# Patient Record
Sex: Female | Born: 1980
Health system: Southern US, Community
[De-identification: ages and names within clinical notes are randomized; demographics above are authoritative.]

## PROBLEM LIST (undated history)

## (undated) DIAGNOSIS — J45909 Unspecified asthma, uncomplicated: Secondary | ICD-10-CM

## (undated) HISTORY — PX: NO PAST SURGERIES: SHX2092

---

## 2013-03-03 NOTE — L&D Delivery Note (Signed)
Delivery Note Progressed to complete dilation and pushed well.  At 4:05 PM a viable and healthy female was delivered via Vaginal, Spontaneous Delivery (Presentation: Right Occiput Anterior).  APGAR: 9, 9; weight .   No difficulty with shoulders. Placenta status: Intact, Spontaneous.  Cord: 3 vessels with the following complications: Nuchal cord, x 1, loose, and foot cord, tight    Anesthesia: Epidural  Episiotomy: None Lacerations:  Suture Repair: 3.0 monocryl Est. Blood Loss (mL): 200  Mom to postpartum.  Baby to Couplet care / Skin to Skin.  Wynelle Bourgeois 11/27/2013, 4:28 PM

## 2013-03-03 NOTE — L&D Delivery Note (Signed)
`````  Attestation of Attending Supervision of Advanced Practitioner: Evaluation and management procedures were performed by the PA/NP/CNM/OB Fellow under my supervision/collaboration. Chart reviewed and agree with management and plan. I was involved in monitoring of labor process.and second stage evals.  Finas Delone V 11/27/2013 5:44 PM

## 2013-04-06 LAB — OB RESULTS CONSOLE GC/CHLAMYDIA
Chlamydia: NEGATIVE
Gonorrhea: NEGATIVE

## 2013-10-20 ENCOUNTER — Other Ambulatory Visit (HOSPITAL_COMMUNITY): Payer: Self-pay | Admitting: Nurse Practitioner

## 2013-10-20 DIAGNOSIS — Z3689 Encounter for other specified antenatal screening: Secondary | ICD-10-CM

## 2013-10-20 LAB — OB RESULTS CONSOLE ANTIBODY SCREEN: Antibody Screen: NEGATIVE

## 2013-10-20 LAB — OB RESULTS CONSOLE RUBELLA ANTIBODY, IGM: Rubella: IMMUNE

## 2013-10-20 LAB — OB RESULTS CONSOLE ABO/RH: RH TYPE: POSITIVE

## 2013-10-20 LAB — OB RESULTS CONSOLE HEPATITIS B SURFACE ANTIGEN: HEP B S AG: NEGATIVE

## 2013-10-20 LAB — OB RESULTS CONSOLE RPR: RPR: NONREACTIVE

## 2013-10-20 LAB — OB RESULTS CONSOLE HIV ANTIBODY (ROUTINE TESTING): HIV: NONREACTIVE

## 2013-10-24 ENCOUNTER — Ambulatory Visit (HOSPITAL_COMMUNITY)
Admission: RE | Admit: 2013-10-24 | Discharge: 2013-10-24 | Disposition: A | Discharge status: Home / Self Care | Payer: Medicaid Other | Source: Ambulatory Visit | Attending: Nurse Practitioner | Admitting: Nurse Practitioner

## 2013-10-24 ENCOUNTER — Other Ambulatory Visit (HOSPITAL_COMMUNITY): Payer: Self-pay | Admitting: Nurse Practitioner

## 2013-10-24 DIAGNOSIS — Z1389 Encounter for screening for other disorder: Secondary | ICD-10-CM | POA: Diagnosis not present

## 2013-10-24 DIAGNOSIS — Z363 Encounter for antenatal screening for malformations: Secondary | ICD-10-CM | POA: Insufficient documentation

## 2013-10-24 DIAGNOSIS — Z3689 Encounter for other specified antenatal screening: Secondary | ICD-10-CM

## 2013-11-11 LAB — OB RESULTS CONSOLE GBS: STREP GROUP B AG: NEGATIVE

## 2013-11-27 ENCOUNTER — Inpatient Hospital Stay (HOSPITAL_COMMUNITY)
Admission: AD | Admit: 2013-11-27 | Discharge: 2013-11-29 | DRG: 775 | Disposition: A | Discharge status: Home / Self Care | Payer: Medicaid Other | Source: Ambulatory Visit | Attending: Obstetrics and Gynecology | Admitting: Obstetrics and Gynecology

## 2013-11-27 ENCOUNTER — Inpatient Hospital Stay (HOSPITAL_COMMUNITY): Payer: Medicaid Other

## 2013-11-27 ENCOUNTER — Encounter (HOSPITAL_COMMUNITY): Payer: Medicaid Other | Admitting: Anesthesiology

## 2013-11-27 ENCOUNTER — Encounter (HOSPITAL_COMMUNITY): Payer: Self-pay

## 2013-11-27 ENCOUNTER — Inpatient Hospital Stay (HOSPITAL_COMMUNITY): Payer: Medicaid Other | Admitting: Anesthesiology

## 2013-11-27 DIAGNOSIS — J45909 Unspecified asthma, uncomplicated: Secondary | ICD-10-CM

## 2013-11-27 DIAGNOSIS — Z8249 Family history of ischemic heart disease and other diseases of the circulatory system: Secondary | ICD-10-CM

## 2013-11-27 DIAGNOSIS — O479 False labor, unspecified: Secondary | ICD-10-CM | POA: Diagnosis present

## 2013-11-27 DIAGNOSIS — O368390 Maternal care for abnormalities of the fetal heart rate or rhythm, unspecified trimester, not applicable or unspecified: Secondary | ICD-10-CM | POA: Diagnosis present

## 2013-11-27 HISTORY — DX: Unspecified asthma, uncomplicated: J45.909

## 2013-11-27 LAB — CBC
HCT: 36.4 % (ref 36.0–46.0)
Hemoglobin: 12.6 g/dL (ref 12.0–15.0)
MCH: 29.1 pg (ref 26.0–34.0)
MCHC: 34.6 g/dL (ref 30.0–36.0)
MCV: 84.1 fL (ref 78.0–100.0)
PLATELETS: 172 10*3/uL (ref 150–400)
RBC: 4.33 MIL/uL (ref 3.87–5.11)
RDW: 14.1 % (ref 11.5–15.5)
WBC: 8.1 10*3/uL (ref 4.0–10.5)

## 2013-11-27 LAB — RPR

## 2013-11-27 MED ORDER — TERBUTALINE SULFATE 1 MG/ML IJ SOLN: 0.2500 mg | Freq: Once | INTRAMUSCULAR | Status: DC | PRN

## 2013-11-27 MED ORDER — LIDOCAINE HCL (PF) 1 % IJ SOLN
30.0000 mL | INTRAMUSCULAR | Status: AC | PRN
  Administered 2013-11-27 (×2): 5 mL via SUBCUTANEOUS

## 2013-11-27 MED ORDER — SENNOSIDES-DOCUSATE SODIUM 8.6-50 MG PO TABS
2.0000 | ORAL_TABLET | ORAL | Status: DC
  Administered 2013-11-27 – 2013-11-28 (×2): 2 via ORAL
  Filled 2013-11-27 (×2): qty 2

## 2013-11-27 MED ORDER — ONDANSETRON HCL 4 MG/2ML IJ SOLN: 4.0000 mg | INTRAMUSCULAR | Status: DC | PRN

## 2013-11-27 MED ORDER — EPHEDRINE 5 MG/ML INJ
10.0000 mg | INTRAVENOUS | Status: DC | PRN
  Filled 2013-11-27: qty 2

## 2013-11-27 MED ORDER — DIBUCAINE 1 % RE OINT: 1.0000 "application " | TOPICAL_OINTMENT | RECTAL | Status: DC | PRN

## 2013-11-27 MED ORDER — CITRIC ACID-SODIUM CITRATE 334-500 MG/5ML PO SOLN
30.0000 mL | ORAL | Status: DC | PRN
  Filled 2013-11-27: qty 15

## 2013-11-27 MED ORDER — OXYTOCIN 40 UNITS IN LACTATED RINGERS INFUSION - SIMPLE MED
62.5000 mL/h | INTRAVENOUS | Status: DC
  Administered 2013-11-27: 62.5 mL/h via INTRAVENOUS
  Filled 2013-11-27: qty 1000

## 2013-11-27 MED ORDER — OXYCODONE-ACETAMINOPHEN 5-325 MG PO TABS: 1.0000 | ORAL_TABLET | ORAL | Status: DC | PRN

## 2013-11-27 MED ORDER — ZOLPIDEM TARTRATE 5 MG PO TABS: 5.0000 mg | ORAL_TABLET | Freq: Every evening | ORAL | Status: DC | PRN

## 2013-11-27 MED ORDER — LACTATED RINGERS IV SOLN
INTRAVENOUS | Status: DC
  Administered 2013-11-27 (×2): via INTRAVENOUS

## 2013-11-27 MED ORDER — PHENYLEPHRINE 40 MCG/ML (10ML) SYRINGE FOR IV PUSH (FOR BLOOD PRESSURE SUPPORT)
80.0000 ug | PREFILLED_SYRINGE | INTRAVENOUS | Status: DC | PRN
  Filled 2013-11-27: qty 2
  Filled 2013-11-27: qty 10

## 2013-11-27 MED ORDER — PRENATAL MULTIVITAMIN CH
1.0000 | ORAL_TABLET | Freq: Every day | ORAL | Status: DC
  Administered 2013-11-28: 1 via ORAL
  Filled 2013-11-27: qty 1

## 2013-11-27 MED ORDER — PHENYLEPHRINE 40 MCG/ML (10ML) SYRINGE FOR IV PUSH (FOR BLOOD PRESSURE SUPPORT)
80.0000 ug | PREFILLED_SYRINGE | INTRAVENOUS | Status: DC | PRN
  Filled 2013-11-27: qty 2

## 2013-11-27 MED ORDER — OXYCODONE-ACETAMINOPHEN 5-325 MG PO TABS
2.0000 | ORAL_TABLET | Freq: Once | ORAL | Status: AC
  Administered 2013-11-27: 2 via ORAL
  Filled 2013-11-27: qty 2

## 2013-11-27 MED ORDER — ACETAMINOPHEN 325 MG PO TABS: 650.0000 mg | ORAL_TABLET | ORAL | Status: DC | PRN

## 2013-11-27 MED ORDER — ONDANSETRON HCL 4 MG PO TABS: 4.0000 mg | ORAL_TABLET | ORAL | Status: DC | PRN

## 2013-11-27 MED ORDER — TETANUS-DIPHTH-ACELL PERTUSSIS 5-2.5-18.5 LF-MCG/0.5 IM SUSP: 0.5000 mL | Freq: Once | INTRAMUSCULAR | Status: DC

## 2013-11-27 MED ORDER — WITCH HAZEL-GLYCERIN EX PADS: 1.0000 "application " | MEDICATED_PAD | CUTANEOUS | Status: DC | PRN

## 2013-11-27 MED ORDER — BENZOCAINE-MENTHOL 20-0.5 % EX AERO
1.0000 "application " | INHALATION_SPRAY | CUTANEOUS | Status: DC | PRN
  Administered 2013-11-27: 1 via TOPICAL
  Filled 2013-11-27: qty 56

## 2013-11-27 MED ORDER — LACTATED RINGERS IV SOLN
INTRAVENOUS | Status: DC
  Administered 2013-11-27: 13:00:00 via INTRAUTERINE

## 2013-11-27 MED ORDER — LANOLIN HYDROUS EX OINT: TOPICAL_OINTMENT | CUTANEOUS | Status: DC | PRN

## 2013-11-27 MED ORDER — LACTATED RINGERS IV SOLN
500.0000 mL | Freq: Once | INTRAVENOUS | Status: AC
  Administered 2013-11-27: 500 mL via INTRAVENOUS

## 2013-11-27 MED ORDER — DIPHENHYDRAMINE HCL 25 MG PO CAPS
25.0000 mg | ORAL_CAPSULE | Freq: Four times a day (QID) | ORAL | Status: DC | PRN
Start: 2013-11-27 — End: 2013-11-29

## 2013-11-27 MED ORDER — FENTANYL 2.5 MCG/ML BUPIVACAINE 1/10 % EPIDURAL INFUSION (WH - ANES)
14.0000 mL/h | INTRAMUSCULAR | Status: DC | PRN
  Administered 2013-11-27 (×2): 14 mL/h via EPIDURAL
  Filled 2013-11-27 (×2): qty 125

## 2013-11-27 MED ORDER — LACTATED RINGERS IV BOLUS (SEPSIS)
500.0000 mL | Freq: Once | INTRAVENOUS | Status: AC
  Administered 2013-11-27: 1000 mL via INTRAVENOUS

## 2013-11-27 MED ORDER — ONDANSETRON HCL 4 MG/2ML IJ SOLN: 4.0000 mg | Freq: Four times a day (QID) | INTRAMUSCULAR | Status: DC | PRN

## 2013-11-27 MED ORDER — LACTATED RINGERS IV SOLN
500.0000 mL | INTRAVENOUS | Status: DC | PRN
  Administered 2013-11-27 (×2): 500 mL via INTRAVENOUS

## 2013-11-27 MED ORDER — OXYCODONE-ACETAMINOPHEN 5-325 MG PO TABS: 2.0000 | ORAL_TABLET | ORAL | Status: DC | PRN

## 2013-11-27 MED ORDER — SIMETHICONE 80 MG PO CHEW: 80.0000 mg | CHEWABLE_TABLET | ORAL | Status: DC | PRN

## 2013-11-27 MED ORDER — OXYTOCIN BOLUS FROM INFUSION
500.0000 mL | INTRAVENOUS | Status: DC
Start: 2013-11-27 — End: 2013-11-27

## 2013-11-27 MED ORDER — DIPHENHYDRAMINE HCL 50 MG/ML IJ SOLN: 12.5000 mg | INTRAMUSCULAR | Status: DC | PRN

## 2013-11-27 MED ORDER — OXYTOCIN 40 UNITS IN LACTATED RINGERS INFUSION - SIMPLE MED
1.0000 m[IU]/min | INTRAVENOUS | Status: DC
Start: 2013-11-27 — End: 2013-11-27
  Administered 2013-11-27: 2 m[IU]/min via INTRAVENOUS

## 2013-11-27 MED ORDER — IBUPROFEN 600 MG PO TABS
600.0000 mg | ORAL_TABLET | Freq: Four times a day (QID) | ORAL | Status: DC
  Administered 2013-11-27 – 2013-11-29 (×7): 600 mg via ORAL
  Filled 2013-11-27 (×6): qty 1

## 2013-11-27 NOTE — Progress Notes (Signed)
Patient ID: Marissa Butler, female   DOB: 1980-08-02, 33 y.o.   MRN: [id number] FHR stable with intermittent variable decels UCs every 7 minutes  Will start Pitocin augmentation

## 2013-11-27 NOTE — Progress Notes (Signed)
Patient ID: Marissa Butler, female   DOB: 1980-06-07, 33 y.o.   MRN: [id number] Doing well.  UCs irregular FHR stable with average variability Intermittent variable decels  Dilation: 1 Effacement (%): 80 Cervical Position: Posterior Station: -2 Presentation: Vertex Exam by:: Dr Loreta Ave (BBOW)  Will continue to observe Dr Emelda Fear aware of tracing

## 2013-11-27 NOTE — Progress Notes (Signed)
LABOR PROGRESS NOTE  Marissa Butler is a 33 y.o. G3P1011 at [redacted]w[redacted]d  admitted for induction of labor due to Non-reactive NST.  Subjective: Uncomfortable with contractions  Objective: BP 127/75  Pulse 88  Temp(Src) 97.9 F (36.6 C) (Oral)  Resp 18 or  Filed Vitals:   11/27/13 0148 11/27/13 0410 11/27/13 0426  BP: 121/71 122/69 127/75  Pulse: 90 82 88  Temp: 98.1 F (36.7 C) 98.1 F (36.7 C) 97.9 F (36.6 C)  TempSrc: Axillary Oral Oral  Resp: FHT:  FHR: 150 bpm, variability: moderate,  accelerations:  Abscent,  decelerations:  Absent UC:   q75min SVE:   Dilation: 1 Effacement (%): 80 Station: -2 Exam by:: Dr Loreta Ave (BBOW)  Dilation: 1 Effacement (%): 80 Cervical Position: Posterior Station: -2 Presentation: Vertex Exam by:: Dr Loreta Ave (BBOW)    Labs: Lab Results  Component Value Date   WBC 8.1 11/27/2013   HGB 12.6 11/27/2013   HCT 36.4 11/27/2013   MCV 84.1 11/27/2013   PLT 172 11/27/2013    Assessment / Plan: Induction of labor due to non-reassuring fetal testing,  progressing well on pitocin  Labor: FB placed without complication at 0523 Fetal Wellbeing:  Category I Pain Control:  Fentanyl AnticipAlia ParsleyVD  Reegan Bouffard ROCIO, MD 11/27/2013, 5:25 AM

## 2013-11-27 NOTE — Anesthesia Preprocedure Evaluation (Signed)
Anesthesia Evaluation  Patient identified by MRN, date of birth, ID band Patient awake    Reviewed: Allergy & Precautions, H&P , NPO status , Patient's Chart, lab work & pertinent test results  History of Anesthesia Complications Negative for: history of anesthetic complications  Airway Mallampati: II TM Distance: >3 FB Neck ROM: Full    Dental  (+) Teeth Intact   Pulmonary asthma ,          Cardiovascular negative cardio ROS  Rhythm:Regular     Neuro/Psych negative neurological ROS     GI/Hepatic negative GI ROS, Neg liver ROS,   Endo/Other  negative endocrine ROS  Renal/GU negative Renal ROS     Musculoskeletal   Abdominal   Peds  Hematology negative hematology ROS (+)   Anesthesia Other Findings   Reproductive/Obstetrics (+) Pregnancy                           Anesthesia Physical Anesthesia Plan  ASA: II  Anesthesia Plan: Epidural   Post-op Pain Management:    Induction:   Airway Management Planned:   Additional Equipment:   Intra-op Plan:   Post-operative Plan:   Informed Consent: I have reviewed the patients History and Physical, chart, labs and discussed the procedure including the risks, benefits and alternatives for the proposed anesthesia with the patient or authorized representative who has indicated his/her understanding and acceptance.     Plan Discussed with: Anesthesiologist  Anesthesia Plan Comments:         Anesthesia Quick Evaluation

## 2013-11-27 NOTE — H&P (Signed)
LABOR ADMISSION HISTORY AND PHYSICAL  Marissa Butler is a 33 y.o. female G3P1011 with IUP at [redacted]w[redacted]d by LMP presenting for contractions, cat II strip nonreactive with minimal variability. She reports +FMs, No LOF, no VB, no blurry vision, headaches or peripheral edema, and RUQ pain. She desires an epidural for labor pain control. She plans on breast and bottle feeding. She request OCPs for birth control.  Dating: By LMP cw [redacted]w[redacted]d sono --->  Estimated Date of Delivery: 12/07/13    Prenatal History/Complications:  Past Medical History: Past Medical History  Diagnosis Date  . Asthma     Past Surgical History: Past Surgical History  Procedure Laterality Date  . No past surgeries      Obstetrical History: OB History   Grav Para Term Preterm Abortions TAB SAB Ect Mult Living   Social History: History   Social History  . Marital Status: Single    Spouse Name: N/A    Number of Children: N/A  . Years of Education: N/A   Social History Main Topics  . Smoking status: Never Smoker   . Smokeless tobacco: Never Used  . Alcohol Use: No  . Drug Use: No  . Sexual Activity: None   Other Topics Concern  . None   Social History Narrative  . None    Family History: Family History  Problem Relation Age of Onset  . Hypertension Mother     Allergies: Allergies  Allergen Reactions  . Shellfish Allergy     No prescriptions prior to admission     Review of Systems   All systems reviewed and negative except as stated in HPI  Blood pressure 121/71, pulse 90, temperature 98.1 F (36.7 C), temperature source Axillary, resp. rate 20. General appearance: alert, cooperative and moderate distress Lungs: clear to auscultation bilaterally Heart: regular rate and rhythm Abdomen: soft, non-tender; bowel sounds normal Pelvic: adequate Extremities: Homans sign is negative, no sign of DVT DTR's not examined Presentation: cephalic Fetal monitoringBaseline: 140  bpm, Variability: Fair (1-6 bpm), Accelerations: not reactive and Decelerations: Absent Uterine activityq12min  Dilation: 1 Effacement (%): 80 Station: -2 Exam by:: Dr Loreta Ave (BBOW)   Prenatal labs: ABO, Rh: O/Positive/-- (08/20 0000) Antibody: Negative (08/20 0000) Rubella:   RPR: Nonreactive (08/20 0000)  HBsAg: Negative (08/20 0000)  HIV: Non-reactive (08/20 0000)  GBS: Negative (09/11 0000)  1 hr Glucola 103 Genetic screening  Not performed Anatomy US normal    No results found for this or any previous visit (from the past 24 hour(s)).  There are no active problems to display for this patient.   Assessment: Marissa Butler is a 33 y.o. G3P1011 at [redacted]w[redacted]d here for labor check with cat II strip, minimal variability and nonreactive, BPP 6/8 admit for induction of labor  #Labor:patient has made cervical change will in MAU (albeit minimal), will monitor and augment if needed, will place foley at L&D #Pain: Judicious with pain medicines given minimal variability #FWB: Cat II, BPP 6/8 (-2 breathing) #ID:  GBS neg #MOF: breast #MOC:OCPs #Circ:  n/a  Jimmie Rueter ROCIO 11/27/2013, 3:40 AM

## 2013-11-27 NOTE — H&P (Signed)
Attestation of Attending Supervision of Fellow: Evaluation and management procedures were performed by the Fellow under my supervision and collaboration.  I have reviewed the Fellow's note and chart, and I agree with the management and plan.    

## 2013-11-27 NOTE — Anesthesia Procedure Notes (Addendum)
Epidural Patient location during procedure: OB Start time: 11/27/2013 7:33 AM End time: 11/27/2013 7:46 AM  Staffing Anesthesiologist: Jassen Sarver, CHRIS Performed by: anesthesiologist   Preanesthetic Checklist Completed: patient identified, surgical consent, pre-op evaluation, timeout performed, IV checked, risks and benefits discussed and monitors and equipment checked  Epidural Patient position: sitting Prep: site prepped and draped and DuraPrep Patient monitoring: heart rate, cardiac monitor, continuous pulse ox and blood pressure Approach: midline Location: L4-L5 Injection technique: LOR saline  Needle:  Needle type: Tuohy  Needle gauge: 17 G Needle length: 9 cm Needle insertion depth: 4 cm Catheter type: closed end flexible Catheter size: 19 Gauge Catheter at skin depth: 12 cm Test dose: Other and negative  Assessment Events: blood not aspirated, injection not painful, no injection resistance, negative IV test and no paresthesia  Additional Notes H+P and labs checked, risks and benefits discussed with the patient, consent obtained, procedure tolerated well and without complications.  Reason for block:procedure for pain

## 2013-11-27 NOTE — Progress Notes (Signed)
Marissa Butler is a 33 y.o. G3P1011 at [redacted]w[redacted]d admitted for nonreactive nst, with cautious monitoring of labor, with intermittent variables with contractions. FHR alternates between Cat I and Cat II. She did not tolerate pitocin after AROM, and that was d/c'd , and has progressed with q 5 min contractions to current dilation of 9/100/0. FSE and IUPC in place. MVU aprox 140  Subjective:   Objective: BP 117/73  Pulse 81  Temp(Src) 98.7 F (37.1 C) (Oral)  Resp 18  Ht  (1.753 m)  Wt 81.194 kg (179 lb)  BMI 26.42 kg/m2  SpO2 100%   Total I/O In: -  Out: 350 [Urine:350]  FHT:  FHR: 150 bpm, variability: moderate,  accelerations:  Present,  decelerations:  Present variables with usual prompt recovery, occasional slow recovery. UC:   regular, every 5 minutes SVE:   Dilation: 9 Effacement (%): 100 Station: 0 Exam by:: dr Emelda Fear  Labs: Lab Results  Component Value Date   WBC 8.1 11/27/2013   HGB 12.6 11/27/2013   HCT 36.4 11/27/2013   MCV 84.1 11/27/2013   PLT 172 11/27/2013    Assessment / Plan: Induction of labor due to non-reassuring fetal testing,  progressing well on pitocin  Labor: slow progress due to suboptimal uterine contraction intensity, but baby recovering between  contractions with current contraction frequency Preeclampsia:   Fetal Wellbeing:  Category II Pain Control:  Epidural I/D:  n/a Anticipated MOD:  NSVD and OR has been kept aware of concern of Cat II hr and room is set up, pt has consented to section if fhr status concern worsens.  Naziah Weckerly V 11/27/2013, 3:16 PM

## 2013-11-27 NOTE — MAU Note (Signed)
Contractions every 3-4 min.  Stronger in last 2 hours. Was 1 cm at last visit.

## 2013-11-28 NOTE — Progress Notes (Signed)
`````  Attestation of Attending Supervision of Advanced Practitioner: Evaluation and management procedures were performed by the PA/NP/CNM/OB Fellow under my supervision/collaboration. Chart reviewed and agree with management and plan. I visited pt, confirmed exam and plan.  Marissa Butler V 11/28/2013 7:59 AM

## 2013-11-28 NOTE — Anesthesia Postprocedure Evaluation (Signed)
  Anesthesia Post-op Note  Patient: Conservator, museum/gallery  Procedure(s) Performed: * No procedures listed *  Patient Location: Mother/Baby  Anesthesia Type:Epidural  Level of Consciousness: awake, alert , oriented and patient cooperative  Airway and Oxygen Therapy: Patient Spontanous Breathing  Post-op Pain: mild  Post-op Assessment: Post-op Vital signs reviewed, Patient's Cardiovascular Status Stable, Respiratory Function Stable, Patent Airway, No signs of Nausea or vomiting, Adequate PO intake, Pain level controlled, No headache, No backache, No residual numbness and No residual motor weakness  Post-op Vital Signs: Reviewed and stable  Last Vitals:  Filed Vitals:   11/28/13 0755  BP: 110/70  Pulse: 84  Temp: 36.8 C  Resp: 20    Complications: No apparent anesthesia complications

## 2013-11-28 NOTE — Progress Notes (Signed)
Post Partum Day 1 Subjective: up ad lib, voiding, tolerating PO and + flatus (-) BM, Pain (0/10), Ambulating  Objective: Blood pressure 110/65, pulse 76, temperature 98.2 F (36.8 C), temperature source Oral, resp. rate 18, height  (1.753 m), weight 81.194 kg (179 lb), SpO2 100.00%, unknown if currently breastfeeding.  Physical Exam:  General: alert, appears stated age and no distress Lochia: appropriate Uterine Fundus: firm DVT Evaluation: No evidence of DVT seen on physical exam. Negative Homan's sign. No cords or calf tenderness.   Recent Labs  11/27/13 0145  HGB 12.6  HCT 36.4    Assessment/Plan: Plan for discharge tomorrow, Breastfeeding and Contraception pills   LOS: 1 day   Rosemary Holms 11/28/2013, 7:49 AM

## 2013-11-28 NOTE — Progress Notes (Signed)
Ur chart review completed.  

## 2013-11-28 NOTE — Lactation Note (Signed)
This note was copied from the chart of Girl Alexus Krisko. Lactation Consultation Note Breast fed her first child for 2 days until she left the hospital and went home. Found it challenging to latch the baby so she started bottle/formula feeding her baby. Mom states she wants to BF this baby and is really going to try harder to stick with it. Mom has good everted nipples, baby is latching well. Discussed importance of depth, obtaining a good latch to prevent nipple soreness, denies any at this time, or painful latches. Hand expression taught noted good colostrum flow. Breast massaging during feeding encouraged. Mom encouraged to feed baby 8-12 times/24 hours and with feeding cues. Mom encouraged to waken baby for feeds.  Educated about newborn behavior. Referred to Baby and Me Book in Breastfeeding section Pg. 22-23 for position options and Proper latch demonstration.Encouraged comfort during BF so colostrum flows better and mom will enjoy the feeding longer. Taking deep breaths and breast massage during BF. Mom encouraged to do skin-to-skin. Swallows heard. WH/LC brochure given w/resources, support groups and LC services. Mom reports + breast changes w/pregnancy.  Patient Name: Girl Kassidi Elza RUEAV'W Date: 11/28/2013 Reason for consult: Initial assessment   Maternal Data Has patient been taught Hand Expression?: Yes Does the patient have breastfeeding experience prior to this delivery?: Yes  Feeding Feeding Type: Breast Fed Length of feed: 20 min (still feeding)  LATCH Score/Interventions Latch: Repeated attempts needed to sustain latch, nipple held in mouth throughout feeding, stimulation needed to elicit sucking reflex.  Audible Swallowing: A few with stimulation Intervention(s): Hand expression  Type of Nipple: Everted at rest and after stimulation  Comfort (Breast/Nipple): Soft / non-tender     Hold (Positioning): Assistance needed to correctly position infant at breast and  maintain latch. Intervention(s): Breastfeeding basics reviewed;Support Pillows;Position options;Skin to skin  LATCH Score: 7  Lactation Tools Discussed/Used     Consult Status Consult Status: Follow-up Date: 11/29/13 Follow-up type: In-patient    Charyl Dancer 11/28/2013, 4:58 AM

## 2013-11-29 MED ORDER — IBUPROFEN 600 MG PO TABS: 600.0000 mg | ORAL_TABLET | Freq: Four times a day (QID) | ORAL | Status: DC | PRN

## 2013-11-29 MED ORDER — PRENATAL MULTIVITAMIN CH: 1.0000 | ORAL_TABLET | Freq: Every day | ORAL | Status: DC

## 2013-11-29 NOTE — Discharge Instructions (Signed)

## 2013-11-29 NOTE — Lactation Note (Signed)
This note was copied from the chart of Marissa Tashaya Bacorn. Lactation Consultation Note  Baby cueing upon entering the room.  Mother placed baby in cradle hold.  Discussed how football hold would help with depth and soreness but mother states she prefers cradle. Some sucks and swallows observed.  Mother states her nipples are tender.  Provided comfort gels and a hand pump. Reviewed cluster feeding, monitoring voids/stools, engorgement care and suggest mother call if she needs further assistance. Mom encouraged to feed baby 8-12 times/24 hours and with feeding cues.    Patient Name: Marissa Butler NWGNF'AToday's Date: 11/29/2013 Reason for consult: Follow-up assessment   Maternal Data    Feeding Feeding Type: Breast Fed Length of feed: 25 min  LATCH Score/Interventions Latch: Grasps breast easily, tongue down, lips flanged, rhythmical sucking.  Audible Swallowing: A few with stimulation  Type of Nipple: Everted at rest and after stimulation  Comfort (Breast/Nipple): Filling, red/small blisters or bruises, mild/mod discomfort  Problem noted: Mild/Moderate discomfort Interventions (Mild/moderate discomfort): Comfort gels;Hand expression  Hold (Positioning): No assistance needed to correctly position infant at breast.  LATCH Score: 8  Lactation Tools Discussed/Used     Consult Status Consult Status: Complete    Hardie PulleyBerkelhammer, Azaryah Oleksy Boschen 11/29/2013, 9:41 AM

## 2013-11-29 NOTE — Discharge Summary (Signed)
Obstetric Discharge Summary Reason for Admission: onset of labor Prenatal Procedures: none Intrapartum Procedures: spontaneous vaginal delivery Postpartum Procedures: none Complications-Operative and Postpartum: vaginal laceration  Delivery Note At 4:05 PM a viable female was delivered via Vaginal, Spontaneous Delivery (Presentation: Right Occiput Anterior).  APGAR: 9, 9; weight 7 lb 4.4 oz (3300 g).   Placenta status: Intact, Spontaneous.  Cord: 3 vessels with the following complications: None.  Cord pH: not obtained  Anesthesia: Epidural  Episiotomy: None Lacerations: 2nd degree;Periurethral;Labial Suture Repair: 3.0 Est. Blood Loss (mL): 200  Mom to postpartum.  Baby to Couplet care / Skin to Skin.  Marissa Butler 11/29/2013, 8:46 AM     Hospital Course:  Active Problems:   Non-reassuring fetal heart tones complicating pregnancy, antepartum   Today: No acute events overnight.  Pt denies problems with ambulating, voiding or po intake.  She denies nausea or vomiting.  Pain is well controlled.  She has had flatus. She has had bowel movement.  Lochia Minimal.  Plan for birth control is  oral contraceptives (estrogen/progesterone).  Method of Feeding: Breast  Marissa Butler is a 33 y.o. [id number] s/p NSVD.  Patient presented to OBT contractions w/ cat II strip nonreactive with minimal variability and was admitted to L&D.  She has postpartum course that was uncomplicated including no problems with ambulating, PO intake, urination, pain, or bleeding. The pt feels ready to go home and  will be discharged with outpatient follow-up.    H/H: Lab Results  Component Value Date/Time   HGB 12.6 11/27/2013  1:45 AM   HCT 36.4 11/27/2013  1:45 AM    Discharge Diagnoses: Term Pregnancy-delivered  Discharge Information: Date: 11/29/2013 Activity: pelvic rest Diet: routine  Medications: PNV and Ibuprofen Breast feeding:  Yes Condition: stable Instructions: refer to  handout Discharge to: home   Discharge Instructions   Activity as tolerated    Complete by:  As directed      Call MD for:  difficulty breathing, headache or visual disturbances    Complete by:  As directed      Call MD for:  hives    Complete by:  As directed      Call MD for:  persistant dizziness or light-headedness    Complete by:  As directed      Call MD for:  persistant nausea and vomiting    Complete by:  As directed      Call MD for:  redness, tenderness, or signs of infection (pain, swelling, redness, odor or green/yellow discharge around incision site)    Complete by:  As directed      Call MD for:  severe uncontrolled pain    Complete by:  As directed      Call MD for:  temperature >100.4    Complete by:  As directed      Diet - low sodium heart healthy    Complete by:  As directed      Discharge instructions    Complete by:  As directed   Taking care of yourself after Baby arrives. Vaginal Bleeding: Vaginal bleeding is common after delivery, with the amount decreasing gradually over 1-2 weeks. If the bleeding increases, is mixed with pus, or is foul-smelling, call your doctor, as this may be a sign of infection.  Abdominal Pain: Abdominal cramping after delivery is common, especially when you breastfeed. The same hormones responsible for letting milk down to your nipple also contract your uterus. If the pain worsens, or occurs more frequently  over 48 hrs after delivery, call your doctor.  Fevers: After delivery you are at increased risk of developing an infection. If you have a fever, increased vaginal bleeding, foul-smelling vaginal discharge, or increased abdominal pain, call your doctor.  Breast Feeding: Feeding every 1.5-3 hours keeps Baby satisfied and your milk in good supply. If 3 hours have gone by and Baby is sleeping, wake him/her up to feed. Nurse for 15-20 minutes on one breast before offering the other. Breast-fed babies often lose up to 7% of their birth  weight in the first few days of life, but should start gaining about an ounce per day after 4 days. For more information about breastfeeding, go to [email or web address]://[email or web address]/breastfeeding.  Mastitis (Breast infection): Breaks in the skin or bacteria passing into your breast ducts can cause an infection. If you notice a triangular shaped area on your breast that is red, warm to the touch and tender, call your doctor. It is safe for Baby and helps you to heal faster if you keep breast feeding through this infection.  Postpartum Depression: Postpartum depression is very common after a woman delivers because of all the hormonal changes happening in her body. If you notice that you start to feel more sad or anxious than usual or have any thoughts of hurting yourself or Baby, tell someone right away.  If you have any questions or concerns, please call your doctor.            Medication List         ibuprofen 600 MG tablet  Commonly known as:  ADVIL,MOTRIN  Take 1 tablet (600 mg total) by mouth every 6 (six) hours as needed for cramping.     prenatal multivitamin Tabs tablet  Take 1 tablet by mouth daily at 12 noon.           Follow-up Information   [facility] [place]. Call today. (to make a postpartum appointment in 4-6 weeks)    Contact information:   [address] [place] [address] [phone]      Marissa Booneoberts, Masaji Billups C ,MD OB Fellow 11/29/2013,8:46 AM

## 2013-11-30 NOTE — Discharge Summary (Signed)
Attestation of Attending Supervision of Obstetric Fellow: Evaluation and management procedures were performed by the Obstetric Fellow under my supervision and collaboration.  I have reviewed the Obstetric Fellow's note and chart, and I agree with the management and plan.  Jacob Stinson, DO Attending Physician Faculty Practice, [facility]  

## 2014-01-03 ENCOUNTER — Encounter (HOSPITAL_COMMUNITY): Payer: Self-pay

## 2014-03-03 LAB — HM PAP SMEAR: HM PAP: NORMAL

## 2015-07-03 IMAGING — US US OB COMP +14 WK
1 series · 12 of 28 positions shown · non-contrast
Comparison: none

[Series 1: us ob detail +14 wk · 12 of 89 slices shown]
[im 4/89]
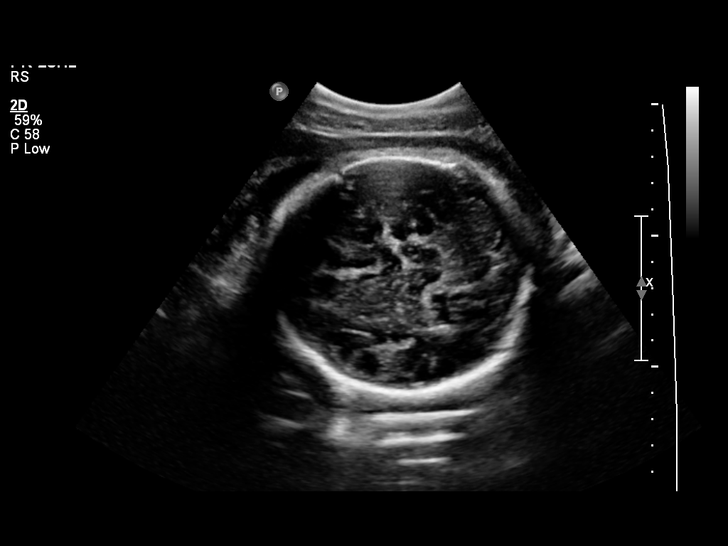
[im 10/89]
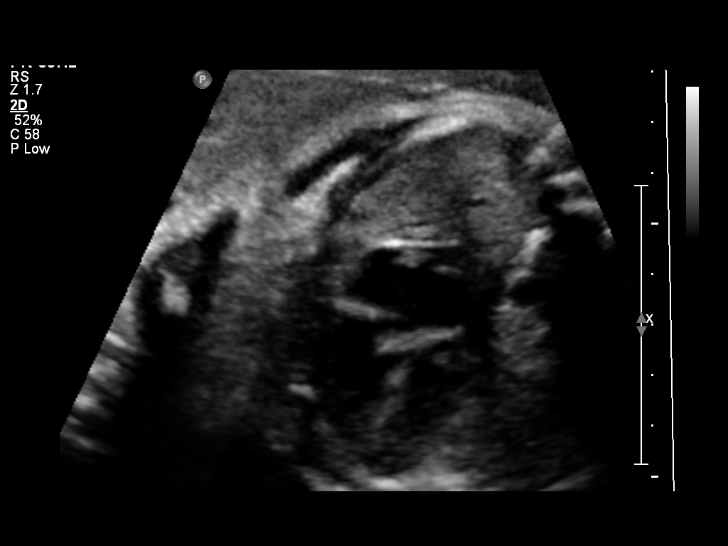
[im 17/89]
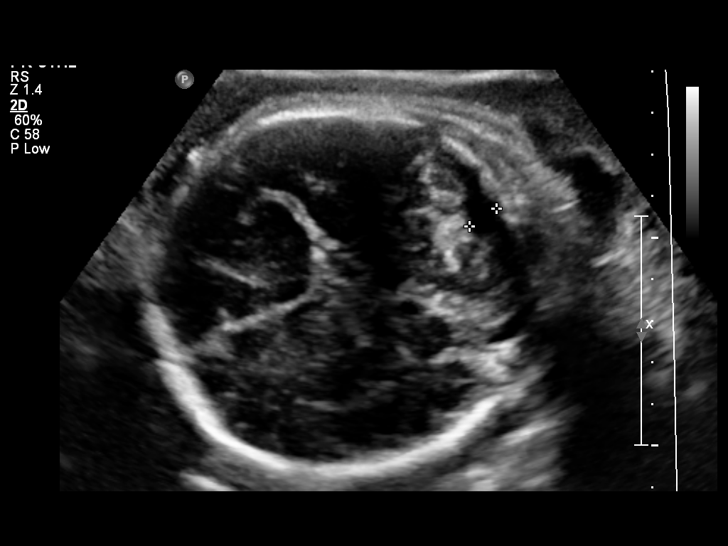
[im 27/89]
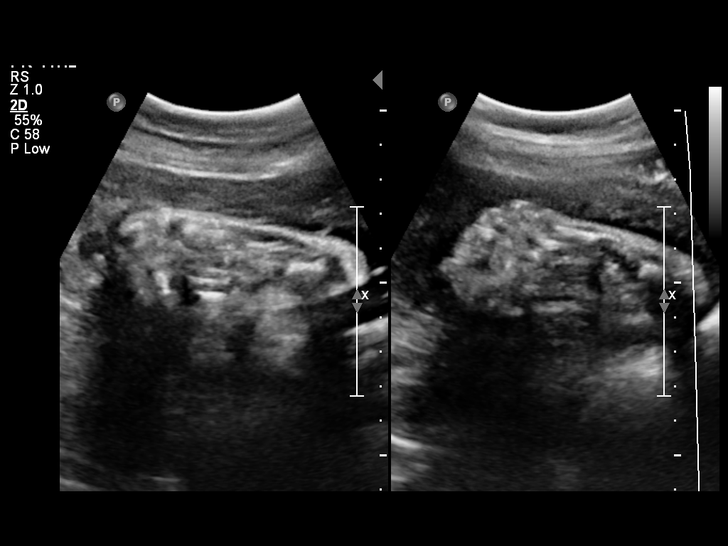
[im 33/89]
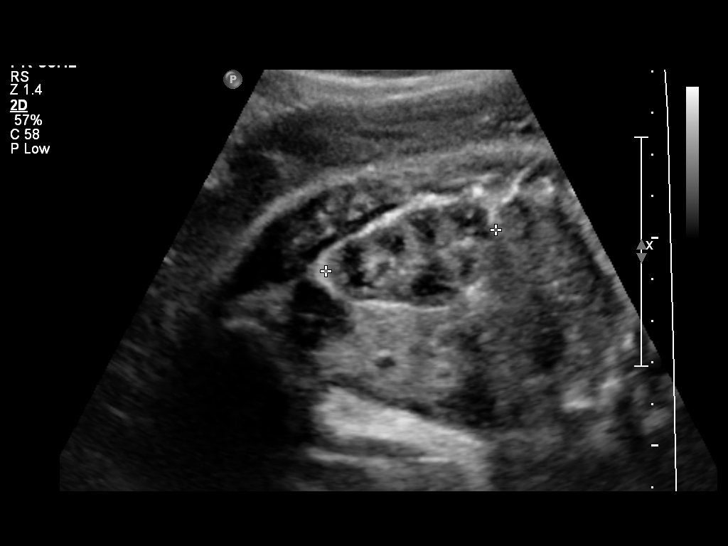
[im 40/89]
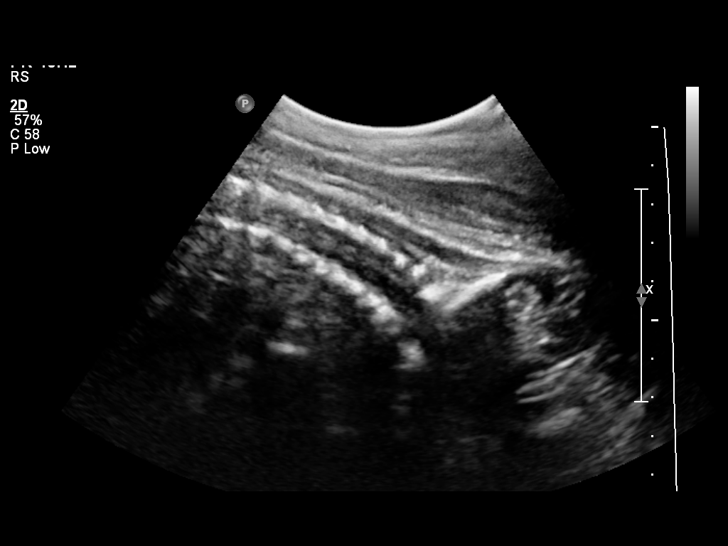
[im 49/89]
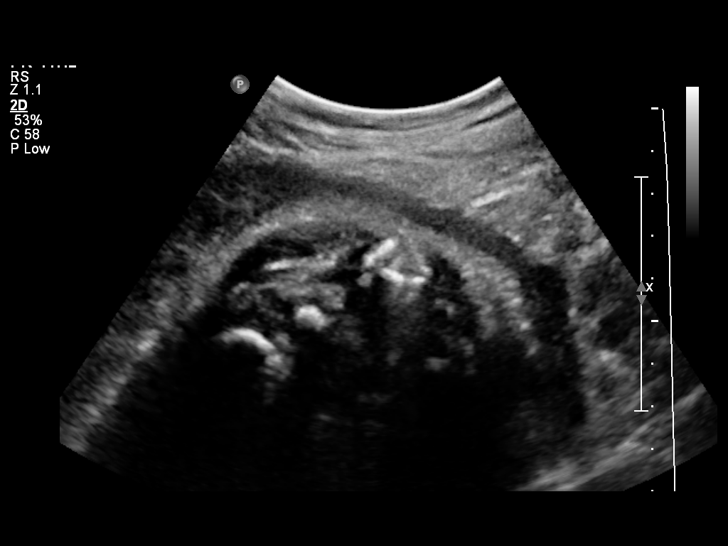
[im 56/89]
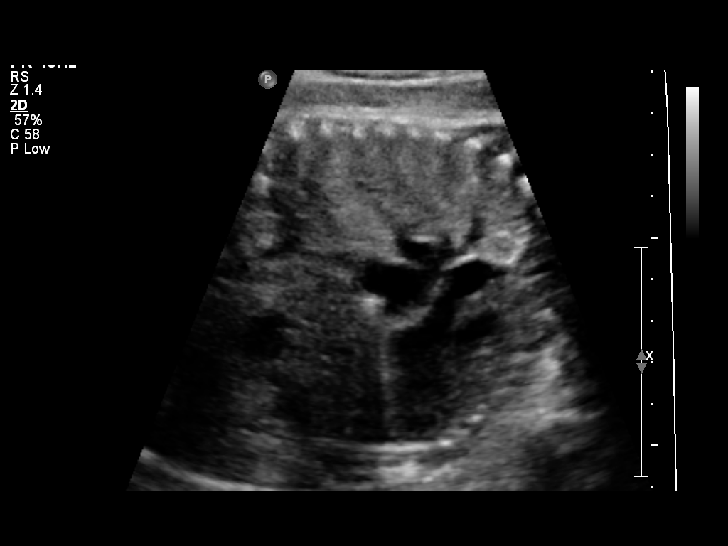
[im 62/89]
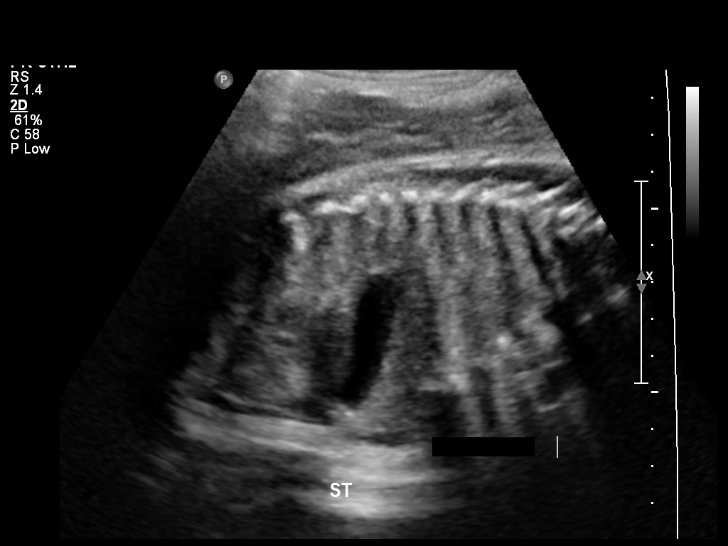
[im 72/89]
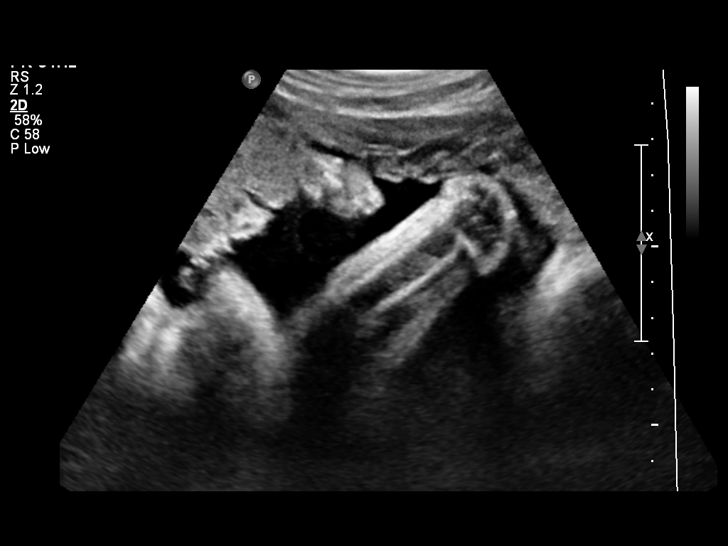
[im 79/89]
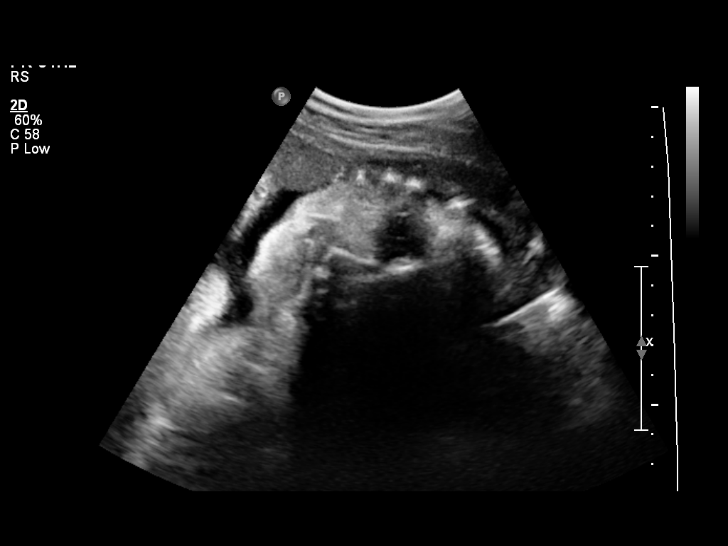
[im 85/89]
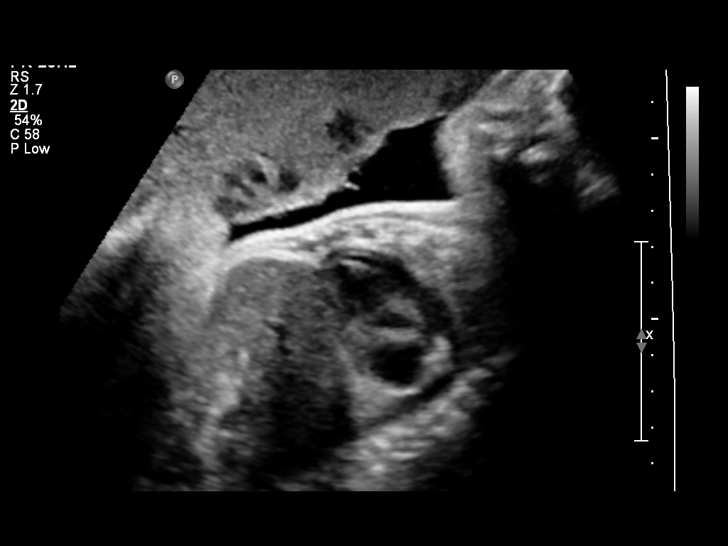

[12 of 28 positions shown; findings below may reference images not displayed]

OBSTETRICS REPORT
                      (Signed Final 10/24/2013 [DATE])

Service(s) Provided

 US OB COMP + 14 WK                                    76805.1
Indications

 Basic anatomic survey
Fetal Evaluation

 Num Of Fetuses:    1
 Fetal Heart Rate:  143                          bpm
 Cardiac Activity:  Observed
 Presentation:      Cephalic
 Placenta:          Anterior, above cervical os
 P. Cord            Visualized, central
 Insertion:

 Amniotic Fluid
 AFI FV:      Subjectively within normal limits
 AFI Sum:     10.06   cm       19  %Tile     Larg Pckt:    3.43  cm
 RUQ:   3.43    cm   RLQ:    3.37   cm    LUQ:   0.02    cm   LLQ:    3.24   cm
Biometry

 BPD:     88.7  mm     G. Age:  35w 6d                CI:        78.48   70 - 86
                                                      FL/HC:      21.1   19.4 -

 HC:     316.7  mm     G. Age:  35w 4d       62  %    HC/AC:      1.08   0.96 -

 AC:     294.6  mm     G. Age:  33w 3d       45  %    FL/BPD:     75.2   71 - 87
 FL:      66.7  mm     G. Age:  34w 2d       56  %    FL/AC:      22.6   20 - 24
 HUM:     57.3  mm     G. Age:  33w 2d       49  %

 Est. FW:    3552  gm      5 lb 3 oz     68  %
Gestational Age

 LMP:           33w 5d        Date:  03/02/13                 EDD:   12/07/13
 U/S Today:     34w 6d                                        EDD:   11/29/13
 Best:          33w 5d     Det. By:  LMP  (03/02/13)          EDD:   12/07/13
Anatomy

 Cranium:          Appears normal         Aortic Arch:      Not well visualized
 Fetal Cavum:      Appears normal         Ductal Arch:      Appears normal
 Ventricles:       Appears normal         Diaphragm:        Appears normal
 Choroid Plexus:   Appears normal         Stomach:          Appears normal, left
                                                            sided
 Cerebellum:       Appears normal         Abdomen:          Appears normal
 Posterior Fossa:  Appears normal         Abdominal Wall:   Not well visualized
 Nuchal Fold:      Appears normal         Cord Vessels:     Appears normal (3
                                                            vessel cord)
 Face:             Appears normal         Kidneys:          Appear normal
                   (orbits and profile)
 Lips:             Appears normal         Bladder:          Appears normal
 Heart:            Appears normal         Spine:            Appears normal
                   (4CH, axis, and
                   situs)
 RVOT:             Appears normal         Lower             Appears normal
                                          Extremities:
 LVOT:             Appears normal         Upper             Not well visualized
                                          Extremities:

 Other:  Female gender. Heels visualized. Nasal bone visualized. Technically
         difficult due to advanced GA and fetal position.
Targeted Anatomy

 Fetal Central Nervous System
 Lat. Ventricles:  4.3                    Cisterna Magna:
Cervix Uterus Adnexa

 Cervix:       Not visualized (advanced GA >64wks)
 Left Ovary:    Size(cm) L: 1.8 x W: 1.63 x H: 1.38  Volume(cc):
                Within normal limits.
 Right Ovary:   No adnexal mass visualized.
Impression

 SIUP at 33+5 weeks
 Normal detailed fetal anatomy; limited views of AA and CI
 Markers of aneuploidy: none
 Normal amniotic fluid volume
 Measurements consistent with LMP dating; EFW at the 68th
 %tile
Recommendations

 Follow-up as clinically indicated

## 2016-03-28 ENCOUNTER — Encounter: Payer: Self-pay | Admitting: Behavioral Health

## 2016-03-28 ENCOUNTER — Telehealth: Payer: Self-pay | Admitting: Behavioral Health

## 2016-03-28 NOTE — Telephone Encounter (Signed)
Pre-Visit Call completed with patient and chart updated.   Pre-Visit Info documented in Specialty Comments under Snap[employer]ot.    

## 2016-03-31 ENCOUNTER — Encounter: Payer: Self-pay | Admitting: Family Medicine

## 2016-03-31 ENCOUNTER — Ambulatory Visit (INDEPENDENT_AMBULATORY_CARE_PROVIDER_SITE_OTHER): Payer: 59 | Admitting: Family Medicine

## 2016-03-31 VITALS — BP 129/81 | HR 81 | Temp 97.1°F | Ht 69.0 in | Wt 166.0 lb

## 2016-03-31 DIAGNOSIS — Z130 Encounter for screening for diseases of the blood and blood-forming organs and certain disorders involving the immune mechanism: Secondary | ICD-10-CM | POA: Diagnosis not present

## 2016-03-31 DIAGNOSIS — Z131 Encounter for screening for diabetes mellitus: Secondary | ICD-10-CM

## 2016-03-31 DIAGNOSIS — Z3009 Encounter for other general counseling and advice on contraception: Secondary | ICD-10-CM

## 2016-03-31 DIAGNOSIS — Z13220 Encounter for screening for lipoid disorders: Secondary | ICD-10-CM | POA: Diagnosis not present

## 2016-03-31 DIAGNOSIS — Z1329 Encounter for screening for other suspected endocrine disorder: Secondary | ICD-10-CM

## 2016-03-31 DIAGNOSIS — J45990 Exercise induced bronchospasm: Secondary | ICD-10-CM

## 2016-03-31 LAB — COMPREHENSIVE METABOLIC PANEL
ALBUMIN: 4.4 g/dL (ref 3.5–5.2)
ALK PHOS: 70 U/L (ref 39–117)
ALT: 16 U/L (ref 0–35)
AST: 16 U/L (ref 0–37)
BUN: 10 mg/dL (ref 6–23)
CHLORIDE: 106 meq/L (ref 96–112)
CO2: 29 mEq/L (ref 19–32)
CREATININE: 1.02 mg/dL (ref 0.40–1.20)
Calcium: 9.7 mg/dL (ref 8.4–10.5)
GFR: 79.57 mL/min (ref >60.00)
Glucose, Bld: 82 mg/dL (ref 70–99)
Potassium: 4.5 mEq/L (ref 3.5–5.1)
SODIUM: 141 meq/L (ref 135–145)
Total Bilirubin: 0.4 mg/dL (ref 0.2–1.2)
Total Protein: 7.5 g/dL (ref 6.0–8.3)

## 2016-03-31 LAB — CBC
HCT: 41.4 % (ref 36.0–46.0)
Hemoglobin: 14.2 g/dL (ref 12.0–15.0)
MCHC: 34.4 g/dL (ref 30.0–36.0)
MCV: 85.9 fl (ref 78.0–100.0)
Platelets: 172 10*3/uL (ref 150.0–400.0)
RBC: 4.82 Mil/uL (ref 3.87–5.11)
RDW: 12.6 % (ref 11.5–15.5)
WBC: 4.3 10*3/uL (ref 4.0–10.5)

## 2016-03-31 LAB — LIPID PANEL
CHOLESTEROL: 161 mg/dL (ref 0–200)
HDL: 57.8 mg/dL (ref >39.00)
LDL CALC: 90 mg/dL (ref 0–99)
NonHDL: 102.84
Total CHOL/HDL Ratio: 3
Triglycerides: 66 mg/dL (ref 0.0–149.0)
VLDL: 13.2 mg/dL (ref 0.0–40.0)

## 2016-03-31 LAB — TSH: TSH: 1 u[IU]/mL (ref 0.35–4.50)

## 2016-03-31 LAB — HEMOGLOBIN A1C: Hgb A1c MFr Bld: 5 % (ref 4.6–6.5)

## 2016-03-31 MED ORDER — LEVONORGESTREL-ETHINYL ESTRAD 0.15-30 MG-MCG PO TABS: 1.0000 | ORAL_TABLET | Freq: Every day | ORAL | 4 refills | Status: DC

## 2016-03-31 MED ORDER — ALBUTEROL SULFATE HFA 108 (90 BASE) MCG/ACT IN AERS: 2.0000 | INHALATION_SPRAY | Freq: Four times a day (QID) | RESPIRATORY_TRACT | 0 refills | Status: DC | PRN

## 2016-03-31 NOTE — Progress Notes (Signed)
Pre visit review using our clinic review tool, if applicable. No additional management support is needed unless otherwise documented below in the visit note. 

## 2016-03-31 NOTE — Patient Instructions (Addendum)
It was very nice to see you today- we will get labs and be back in touch with your results asap Try the albuterol 1-2 puffs prior to exercise; let me know if this doe NOT seem to help you Take care and I am glad to see you as needed for physical exam or illness

## 2016-03-31 NOTE — Progress Notes (Signed)
[facility] [place] [address], [address] [place], [place] [address] [phone] [phone]  Date:  03/31/2016   Name:  Marissa CullensShavaughn Dicamillo   DOB:  02/19/1981   MRN:  [id number]  PCP:  Abbe AmsterdamOPLAND,JESSICA, MD    Chief Complaint: Establish Care (Pt here to est care. Had flu vaccine already. )   History of Present Illness:  Marissa Butler is a 36 y.o. very pleasant female patient who presents with the following:  Here today as a new patient to establish care- [place] moved [place] from [place] family members.  She has daughters ages 442 and [phone] She has generally been pretty healthy. She did have asthma in her younger years but does not have any wheezing.  However she does notice that she is sometimes SOB with exercise and wonders if this could be due to asthma Works in pt experience for [employer]  She had her flu shot for the year.    She has not had any recent labs but would like to have these today.  She is NOT fasting Her last pap was about 18 months ago.  Never had an abnl pap  She is on OCP- would like a Refill of same brand she is currently taking.  No history of DVT., PE, cancer, she does not smoke  LMP 1/19  Patient Active Problem List   Diagnosis Date Noted  . Non-reassuring fetal heart tones complicating pregnancy, antepartum 11/27/2013    Past Medical History:  Diagnosis Date  . Asthma     Past Surgical History:  Procedure Laterality Date  . NO PAST SURGERIES      Social History  Substance Use Topics  . Smoking status: Never Smoker  . Smokeless tobacco: Never Used  . Alcohol use No    Family History  Problem Relation Age of Onset  . Hypertension Mother     Allergies  Allergen Reactions  . Shellfish Allergy     Medication list has been reviewed and updated.  Current Outpatient Prescriptions on File Prior to Visit  Medication Sig Dispense Refill  . Naproxen Sodium (ALEVE PO) Take by mouth as needed.     No  current facility-administered medications on file prior to visit.     Review of Systems:  As per HPI- otherwise negative.   Physical Examination: Vitals:   03/31/16 1349  BP: 129/81  Pulse: 81  Temp: 97.1 F (36.2 C)   Vitals:   03/31/16 1349  Weight: 166 lb (75.3 kg)  Height: 5\' 9"  (1.753 m)   Body mass index is 24.51 kg/m. Ideal Body Weight: Weight in (lb) to have BMI = 25: 168.9  GEN: WDWN, NAD, Non-toxic, A & O x 3, normal weight, looks well and healthy HEENT: Atraumatic, Normocephalic. Neck supple. No masses, No LAD.  Bilateral TM wnl, oropharynx normal.  PEERL,EOMI.   Ears and Nose: No external deformity. CV: RRR, No M/G/R. No JVD. No thrill. No extra heart sounds. PULM: CTA B, no wheezes, crackles, rhonchi. No retractions. No resp. distress. No accessory muscle use. EXTR: No c/c/e NEURO Normal gait.  PSYCH: Normally interactive. Conversant. Not depressed or anxious appearing.  Calm demeanor.    Assessment and Plan: Screening for deficiency anemia - Plan: CBC  Screening for hyperlipidemia - Plan: Lipid panel  Screening for thyroid disorder - Plan: TSH  Screening for diabetes mellitus - Plan: Comprehensive metabolic panel, Hemoglobin A1c  Exercise-induced asthma - Plan: albuterol (PROVENTIL HFA;VENTOLIN HFA)  108 (90 Base) MCG/ACT inhaler  Encounter for other general counseling or advice on contraception - Plan: levonorgestrel-ethinyl estradiol (NORDETTE) 0.15-30 MG-MCG tablet  Here today to establish care Will obtain screening labs as above She will try albuterol as needed prior to exercise- will let me know if helpful Refilled her OCP for her today- she will continue to take this as usual   Signed Abbe Amsterdam, MD

## 2017-05-14 ENCOUNTER — Telehealth: Payer: Self-pay | Admitting: Family Medicine

## 2017-05-14 DIAGNOSIS — Z3009 Encounter for other general counseling and advice on contraception: Secondary | ICD-10-CM

## 2017-05-14 MED ORDER — LEVONORGESTREL-ETHINYL ESTRAD 0.15-30 MG-MCG PO TABS
1.0000 | ORAL_TABLET | Freq: Every day | ORAL | 0 refills | Status: DC
Start: 2017-05-14 — End: 2017-08-05

## 2017-05-14 NOTE — Telephone Encounter (Signed)
Copied from CRM [id number]. Topic: Quick Communication - Rx Refill/Question >> May 14, 2017 10:57 AM Maia Pettiesrtiz, Kristie S wrote: Medication: levonorgestrel-ethinyl estradiol (NORDETTE) 0.15-30 MG-MCG tablet - pt is scheduled for cpe 06/01/17 (first avail) Has the patient contacted their pharmacy? Yes.  No refills left Preferred Pharmacy (with phone number or street name): [id number] [place]- [place], [place] - [address] [phone] (Phone) [phone] (Fax)

## 2017-05-14 NOTE — Telephone Encounter (Signed)
Refill sent to pharmacy per pt request.

## 2017-05-31 NOTE — Progress Notes (Addendum)
[facility] at [facility] [address], [address] [place], [place] [address] [phone]  Date:  06/01/2017   Name:  Marissa Butler   DOB:  1980/08/10   MRN:  [id number]  PCP:  Pearline Cables, MD    Chief Complaint: Annual Exam (no pap)   History of Present Illness:  Marissa Butler is a 37 y.o. very pleasant female patient who presents with the following:  Here today for a CPE Last seen by myself in January of 2018:  Here today as a new patient to establish care- recently moved here from [place] to follow family members.  She has daughters ages 67 and 76 She has generally been pretty healthy. She did have asthma in her younger years but does not have any wheezing.  However she does notice that she is sometimes SOB with exercise and wonders if this could be due to asthma Works in pt experience for [employer] She had her flu shot for the year.   She has not had any recent labs but would like to have these today.  She is NOT fasting Her last pap was about 18 months ago.  Never had an abnl pap  Pap: last in 2015, she will get this done today. Never had an abnormal  Labs: 1/18- all ok Tdap: 2015 Flu: UTD  She is fasting today Her daughters are doing well She is using her inhaler for exercise but othewise her breathing has been ok  Not a smoker, not a drinker She does exercise daily- she generally lifts weight and does a bit of cardio as well Her eldest is graduating from HS this spring and is looking at colleges- hopefully [employer] or [employer] Her youngest stays home with her GM,will start pre-k next year  She is on OCP  She really has no concerns, she is generally in excellent health and feeling well    There are no active problems to display for this patient.   Past Medical History:  Diagnosis Date  . Asthma     Past Surgical History:  Procedure Laterality Date  . NO PAST SURGERIES      Social History   Tobacco Use  .  Smoking status: Never Smoker  . Smokeless tobacco: Never Used  Substance Use Topics  . Alcohol use: No  . Drug use: No    Family History  Problem Relation Age of Onset  . Hypertension Mother     Allergies  Allergen Reactions  . Shellfish Allergy     Medication list has been reviewed and updated.  Current Outpatient Medications on File Prior to Visit  Medication Sig Dispense Refill  . albuterol (PROVENTIL HFA;VENTOLIN HFA) 108 (90 Base) MCG/ACT inhaler Inhale 2 puffs into the lungs every 6 (six) hours as needed for wheezing or shortness of breath. 1 Inhaler 0  . levonorgestrel-ethinyl estradiol (NORDETTE) 0.15-30 MG-MCG tablet Take 1 tablet by mouth daily. 3 Package 0  . Naproxen Sodium (ALEVE PO) Take by mouth as needed.     No current facility-administered medications on file prior to visit.     Review of Systems:  As per HPI- otherwise negative.   Physical Examination: Vitals:   06/01/17 1409  BP: 120/72  Pulse: 77  Resp: 16  SpO2: 98%   Vitals:   06/01/17 1409  Weight: 161 lb 6.4 oz (73.2 kg)  Height: 5\' 9"  (1.753 m)   Body mass index is 23.83 kg/m. Ideal Body Weight:  Weight in (lb) to have BMI = 25: 168.9  GEN: WDWN, NAD, Non-toxic, A & O x 3, looks well, normal weight HEENT: Atraumatic, Normocephalic. Neck supple. No masses, No LAD.  Bilateral TM wnl, oropharynx normal.  PEERL,EOMI.   Ears and Nose: No external deformity. CV: RRR, No M/G/R. No JVD. No thrill. No extra heart sounds. PULM: CTA B, no wheezes, crackles, rhonchi. No retractions. No resp. distress. No accessory muscle use. ABD: S, NT, ND, +BS. No rebound. No HSM. EXTR: No c/c/e NEURO Normal gait.  PSYCH: Normally interactive. Conversant. Not depressed or anxious appearing.  Calm demeanor.  Breast: normal exam, no masses/ dimpling/ discharge Pelvic: normal, no vaginal lesions or discharge. Uterus normal, no CMT, no adnexal tendereness or masses    Assessment and Plan: Physical  exam  Screening for deficiency anemia - Plan: Lipid panel  Screening for hyperlipidemia - Plan: CBC  Screening for diabetes mellitus - Plan: Comprehensive metabolic panel, Hemoglobin A1c  Screening for cervical cancer - Plan: Cytology - PAP  Healthy woman here today for a CPE Await labs Encouraged her to continue her healthy habits  Signed Abbe AmsterdamJessica Moyses Pavey, MD  Received her labs 4/2-  Results for orders placed or performed in visit on 06/01/17  CBC  Result Value Ref Range   WBC 5.8 4.0 - 10.5 K/uL   RBC 5.09 3.87 - 5.11 Mil/uL   Platelets 217.0 150.0 - 400.0 K/uL   Hemoglobin 14.6 12.0 - 15.0 g/dL   HCT 78.243.8 95.636.0 - 21.346.0 %   MCV 85.9 78.0 - 100.0 fl   MCHC 33.4 30.0 - 36.0 g/dL   RDW 08.613.0 57.811.5 - 46.915.5 %  Comprehensive metabolic panel  Result Value Ref Range   Sodium 136 135 - 145 mEq/L   Potassium 3.6 3.5 - 5.1 mEq/L   Chloride 103 96 - 112 mEq/L   CO2 25 19 - 32 mEq/L   Glucose, Bld 66 (L) 70 - 99 mg/dL   BUN 12 6 - 23 mg/dL   Creatinine, Ser 6.291.10 0.40 - 1.20 mg/dL   Total Bilirubin 0.6 0.2 - 1.2 mg/dL   Alkaline Phosphatase 61 39 - 117 U/L   AST 16 0 - 37 U/L   ALT 13 0 - 35 U/L   Total Protein 7.5 6.0 - 8.3 g/dL   Albumin 4.0 3.5 - 5.2 g/dL   Calcium 8.8 8.4 - 52.810.5 mg/dL   GFR 41.3272.44 >44.01>60.00 mL/min  Hemoglobin A1c  Result Value Ref Range   Hgb A1c MFr Bld 5.2 4.6 - 6.5 %  Lipid panel  Result Value Ref Range   Cholesterol 157 0 - 200 mg/dL   Triglycerides 02.743.0 0.0 - 149.0 mg/dL   HDL 25.3656.10 >64.40>39.00 mg/dL   VLDL 8.6 0.0 - 34.740.0 mg/dL   LDL Cholesterol 92 0 - 99 mg/dL   Total CHOL/HDL Ratio 3    NonHDL 100.95    Message to pt, still await pap

## 2017-06-01 ENCOUNTER — Ambulatory Visit (INDEPENDENT_AMBULATORY_CARE_PROVIDER_SITE_OTHER): Payer: 59 | Admitting: Family Medicine

## 2017-06-01 ENCOUNTER — Other Ambulatory Visit (HOSPITAL_COMMUNITY)
Admission: RE | Admit: 2017-06-01 | Discharge: 2017-06-01 | Disposition: A | Discharge status: Home / Self Care | Payer: 59 | Source: Ambulatory Visit | Attending: Family Medicine | Admitting: Family Medicine

## 2017-06-01 ENCOUNTER — Encounter: Payer: Self-pay | Admitting: Family Medicine

## 2017-06-01 VITALS — BP 120/72 | HR 77 | Resp 16 | Ht 69.0 in | Wt 161.4 lb

## 2017-06-01 DIAGNOSIS — Z0000 Encounter for general adult medical examination without abnormal findings: Secondary | ICD-10-CM | POA: Diagnosis not present

## 2017-06-01 DIAGNOSIS — Z124 Encounter for screening for malignant neoplasm of cervix: Secondary | ICD-10-CM

## 2017-06-01 DIAGNOSIS — Z130 Encounter for screening for diseases of the blood and blood-forming organs and certain disorders involving the immune mechanism: Secondary | ICD-10-CM

## 2017-06-01 DIAGNOSIS — Z13220 Encounter for screening for lipoid disorders: Secondary | ICD-10-CM | POA: Diagnosis not present

## 2017-06-01 DIAGNOSIS — Z131 Encounter for screening for diabetes mellitus: Secondary | ICD-10-CM | POA: Diagnosis not present

## 2017-06-01 NOTE — Patient Instructions (Signed)
Great to see you again today-  I will be in touch with your labs and pap asap  Health Maintenance, Female Adopting a healthy lifestyle and getting preventive care can go a long way to promote health and wellness. Talk with your health care provider about what schedule of regular examinations is right for you. This is a good chance for you to check in with your provider about disease prevention and staying healthy. In between checkups, there are plenty of things you can do on your own. Experts have done a lot of research about which lifestyle changes and preventive measures are most likely to keep you healthy. Ask your health care provider for more information. Weight and diet Eat a healthy diet  Be sure to include plenty of vegetables, fruits, low-fat dairy products, and lean protein.  Do not eat a lot of foods high in solid fats, added sugars, or salt.  Get regular exercise. This is one of the most important things you can do for your health. ? Most adults should exercise for at least 150 minutes each week. The exercise should increase your heart rate and make you sweat (moderate-intensity exercise). ? Most adults should also do strengthening exercises at least twice a week. This is in addition to the moderate-intensity exercise.  Maintain a healthy weight  Body mass index (BMI) is a measurement that can be used to identify possible weight problems. It estimates body fat based on height and weight. Your health care provider can help determine your BMI and help you achieve or maintain a healthy weight.  For females 47 years of age and older: ? A BMI below 18.5 is considered underweight. ? A BMI of 18.5 to 24.9 is normal. ? A BMI of 25 to 29.9 is considered overweight. ? A BMI of 30 and above is considered obese.  Watch levels of cholesterol and blood lipids  You should start having your blood tested for lipids and cholesterol at 36 years of age, then have this test every 5 years.  You  may need to have your cholesterol levels checked more often if: ? Your lipid or cholesterol levels are high. ? You are older than 36 years of age. ? You are at high risk for heart disease.  Cancer screening Lung Cancer  Lung cancer screening is recommended for adults 51-78 years old who are at high risk for lung cancer because of a history of smoking.  A yearly low-dose CT scan of the lungs is recommended for people who: ? Currently smoke. ? Have quit within the past 15 years. ? Have at least a 30-pack-year history of smoking. A pack year is smoking an average of one pack of cigarettes a day for 1 year.  Yearly screening should continue until it has been 15 years since you quit.  Yearly screening should stop if you develop a health problem that would prevent you from having lung cancer treatment.  Breast Cancer  Practice breast self-awareness. This means understanding how your breasts normally appear and feel.  It also means doing regular breast self-exams. Let your health care provider know about any changes, no matter how small.  If you are in your 20s or 30s, you should have a clinical breast exam (CBE) by a health care provider every 1-3 years as part of a regular health exam.  If you are 53 or older, have a CBE every year. Also consider having a breast X-ray (mammogram) every year.  If you have a family history  of breast cancer, talk to your health care provider about genetic screening.  If you are at high risk for breast cancer, talk to your health care provider about having an MRI and a mammogram every year.  Breast cancer gene (BRCA) assessment is recommended for women who have family members with BRCA-related cancers. BRCA-related cancers include: ? Breast. ? Ovarian. ? Tubal. ? Peritoneal cancers.  Results of the assessment will determine the need for genetic counseling and BRCA1 and BRCA2 testing.  Cervical Cancer Your health care provider may recommend that you  be screened regularly for cancer of the pelvic organs (ovaries, uterus, and vagina). This screening involves a pelvic examination, including checking for microscopic changes to the surface of your cervix (Pap test). You may be encouraged to have this screening done every 3 years, beginning at age 25.  For women ages 44-65, health care providers may recommend pelvic exams and Pap testing every 3 years, or they may recommend the Pap and pelvic exam, combined with testing for human papilloma virus (HPV), every 5 years. Some types of HPV increase your risk of cervical cancer. Testing for HPV may also be done on women of any age with unclear Pap test results.  Other health care providers may not recommend any screening for nonpregnant women who are considered low risk for pelvic cancer and who do not have symptoms. Ask your health care provider if a screening pelvic exam is right for you.  If you have had past treatment for cervical cancer or a condition that could lead to cancer, you need Pap tests and screening for cancer for at least 20 years after your treatment. If Pap tests have been discontinued, your risk factors (such as having a new sexual partner) need to be reassessed to determine if screening should resume. Some women have medical problems that increase the chance of getting cervical cancer. In these cases, your health care provider may recommend more frequent screening and Pap tests.  Colorectal Cancer  This type of cancer can be detected and often prevented.  Routine colorectal cancer screening usually begins at 36 years of age and continues through 36 years of age.  Your health care provider may recommend screening at an earlier age if you have risk factors for colon cancer.  Your health care provider may also recommend using home test kits to check for hidden blood in the stool.  A small camera at the end of a tube can be used to examine your colon directly (sigmoidoscopy or  colonoscopy). This is done to check for the earliest forms of colorectal cancer.  Routine screening usually begins at age 52.  Direct examination of the colon should be repeated every 5-10 years through 36 years of age. However, you may need to be screened more often if early forms of precancerous polyps or small growths are found.  Skin Cancer  Check your skin from head to toe regularly.  Tell your health care provider about any new moles or changes in moles, especially if there is a change in a mole's shape or color.  Also tell your health care provider if you have a mole that is larger than the size of a pencil eraser.  Always use sunscreen. Apply sunscreen liberally and repeatedly throughout the day.  Protect yourself by wearing long sleeves, pants, a wide-brimmed hat, and sunglasses whenever you are outside.  Heart disease, diabetes, and high blood pressure  High blood pressure causes heart disease and increases the risk of stroke. High  blood pressure is more likely to develop in: ? People who have blood pressure in the high end of the normal range (130-139/85-89 mm Hg). ? People who are overweight or obese. ? People who are African American.  If you are 37-23 years of age, have your blood pressure checked every 3-5 years. If you are 10 years of age or older, have your blood pressure checked every year. You should have your blood pressure measured twice-once when you are at a hospital or clinic, and once when you are not at a hospital or clinic. Record the average of the two measurements. To check your blood pressure when you are not at a hospital or clinic, you can use: ? An automated blood pressure machine at a pharmacy. ? A home blood pressure monitor.  If you are between 87 years and 44 years old, ask your health care provider if you should take aspirin to prevent strokes.  Have regular diabetes screenings. This involves taking a blood sample to check your fasting blood sugar  level. ? If you are at a normal weight and have a low risk for diabetes, have this test once every three years after 36 years of age. ? If you are overweight and have a high risk for diabetes, consider being tested at a younger age or more often. Preventing infection Hepatitis B  If you have a higher risk for hepatitis B, you should be screened for this virus. You are considered at high risk for hepatitis B if: ? You were born in a country where hepatitis B is common. Ask your health care provider which countries are considered high risk. ? Your parents were born in a high-risk country, and you have not been immunized against hepatitis B (hepatitis B vaccine). ? You have HIV or AIDS. ? You use needles to inject street drugs. ? You live with someone who has hepatitis B. ? You have had sex with someone who has hepatitis B. ? You get hemodialysis treatment. ? You take certain medicines for conditions, including cancer, organ transplantation, and autoimmune conditions.  Hepatitis C  Blood testing is recommended for: ? Everyone born from [birth year] through 1964. ? Anyone with known risk factors for hepatitis C.  Sexually transmitted infections (STIs)  You should be screened for sexually transmitted infections (STIs) including gonorrhea and chlamydia if: ? You are sexually active and are younger than 36 years of age. ? You are older than 36 years of age and your health care provider tells you that you are at risk for this type of infection. ? Your sexual activity has changed since you were last screened and you are at an increased risk for chlamydia or gonorrhea. Ask your health care provider if you are at risk.  If you do not have HIV, but are at risk, it may be recommended that you take a prescription medicine daily to prevent HIV infection. This is called pre-exposure prophylaxis (PrEP). You are considered at risk if: ? You are sexually active and do not regularly use condoms or know the HIV  status of your partner(s). ? You take drugs by injection. ? You are sexually active with a partner who has HIV.  Talk with your health care provider about whether you are at high risk of being infected with HIV. If you choose to begin PrEP, you should first be tested for HIV. You should then be tested every 3 months for as long as you are taking PrEP. Pregnancy  If you are  premenopausal and you may become pregnant, ask your health care provider about preconception counseling.  If you may become pregnant, take 400 to 800 micrograms (mcg) of folic acid every day.  If you want to prevent pregnancy, talk to your health care provider about birth control (contraception). Osteoporosis and menopause  Osteoporosis is a disease in which the bones lose minerals and strength with aging. This can result in serious bone fractures. Your risk for osteoporosis can be identified using a bone density scan.  If you are 59 years of age or older, or if you are at risk for osteoporosis and fractures, ask your health care provider if you should be screened.  Ask your health care provider whether you should take a calcium or vitamin D supplement to lower your risk for osteoporosis.  Menopause may have certain physical symptoms and risks.  Hormone replacement therapy may reduce some of these symptoms and risks. Talk to your health care provider about whether hormone replacement therapy is right for you. Follow these instructions at home:  Schedule regular health, dental, and eye exams.  Stay current with your immunizations.  Do not use any tobacco products including cigarettes, chewing tobacco, or electronic cigarettes.  If you are pregnant, do not drink alcohol.  If you are breastfeeding, limit how much and how often you drink alcohol.  Limit alcohol intake to no more than 1 drink per day for nonpregnant women. One drink equals 12 ounces of beer, 5 ounces of wine, or 1 ounces of hard liquor.  Do not  use street drugs.  Do not share needles.  Ask your health care provider for help if you need support or information about quitting drugs.  Tell your health care provider if you often feel depressed.  Tell your health care provider if you have ever been abused or do not feel safe at home. This information is not intended to replace advice given to you by your health care provider. Make sure you discuss any questions you have with your health care provider. Document Released: 09/02/2010 Document Revised: 07/26/2015 Document Reviewed: 11/21/2014 Elsevier Interactive Patient Education  [employer].

## 2017-06-02 ENCOUNTER — Encounter: Payer: Self-pay | Admitting: Family Medicine

## 2017-06-02 LAB — CBC
HCT: 43.8 % (ref 36.0–46.0)
HEMOGLOBIN: 14.6 g/dL (ref 12.0–15.0)
MCHC: 33.4 g/dL (ref 30.0–36.0)
MCV: 85.9 fl (ref 78.0–100.0)
PLATELETS: 217 10*3/uL (ref 150.0–400.0)
RBC: 5.09 Mil/uL (ref 3.87–5.11)
RDW: 13 % (ref 11.5–15.5)
WBC: 5.8 10*3/uL (ref 4.0–10.5)

## 2017-06-02 LAB — LIPID PANEL
CHOL/HDL RATIO: 3
CHOLESTEROL: 157 mg/dL (ref 0–200)
HDL: 56.1 mg/dL (ref >39.00)
LDL Cholesterol: 92 mg/dL (ref 0–99)
NonHDL: 100.95
TRIGLYCERIDES: 43 mg/dL (ref 0.0–149.0)
VLDL: 8.6 mg/dL (ref 0.0–40.0)

## 2017-06-02 LAB — COMPREHENSIVE METABOLIC PANEL
ALT: 13 U/L (ref 0–35)
AST: 16 U/L (ref 0–37)
Albumin: 4 g/dL (ref 3.5–5.2)
Alkaline Phosphatase: 61 U/L (ref 39–117)
BILIRUBIN TOTAL: 0.6 mg/dL (ref 0.2–1.2)
BUN: 12 mg/dL (ref 6–23)
CALCIUM: 8.8 mg/dL (ref 8.4–10.5)
CO2: 25 mEq/L (ref 19–32)
Chloride: 103 mEq/L (ref 96–112)
Creatinine, Ser: 1.1 mg/dL (ref 0.40–1.20)
GFR: 72.44 mL/min (ref >60.00)
GLUCOSE: 66 mg/dL — AB (ref 70–99)
POTASSIUM: 3.6 meq/L (ref 3.5–5.1)
Sodium: 136 mEq/L (ref 135–145)
TOTAL PROTEIN: 7.5 g/dL (ref 6.0–8.3)

## 2017-06-02 LAB — HEMOGLOBIN A1C: HEMOGLOBIN A1C: 5.2 % (ref 4.6–6.5)

## 2017-06-03 ENCOUNTER — Encounter: Payer: Self-pay | Admitting: Family Medicine

## 2017-06-03 LAB — CYTOLOGY - PAP
Adequacy: ABSENT
DIAGNOSIS: NEGATIVE
HPV (WINDOPATH): NOT DETECTED

## 2017-08-05 ENCOUNTER — Other Ambulatory Visit: Payer: Self-pay | Admitting: Family Medicine

## 2017-08-05 DIAGNOSIS — Z3009 Encounter for other general counseling and advice on contraception: Secondary | ICD-10-CM

## 2018-01-08 ENCOUNTER — Other Ambulatory Visit: Payer: Self-pay | Admitting: Family Medicine

## 2018-01-08 DIAGNOSIS — Z3009 Encounter for other general counseling and advice on contraception: Secondary | ICD-10-CM

## 2018-06-21 ENCOUNTER — Ambulatory Visit (INDEPENDENT_AMBULATORY_CARE_PROVIDER_SITE_OTHER): Payer: 59 | Admitting: Family Medicine

## 2018-06-21 ENCOUNTER — Other Ambulatory Visit: Payer: Self-pay

## 2018-06-21 ENCOUNTER — Encounter: Payer: Self-pay | Admitting: Family Medicine

## 2018-06-21 ENCOUNTER — Ambulatory Visit: Payer: Self-pay | Admitting: Family Medicine

## 2018-06-21 VITALS — BP 120/82 | HR 81 | Temp 98.5°F | Resp 16 | Ht 69.0 in | Wt 169.0 lb

## 2018-06-21 DIAGNOSIS — K921 Melena: Secondary | ICD-10-CM | POA: Diagnosis not present

## 2018-06-21 LAB — CBC
HCT: 44.7 % (ref 35.0–45.0)
Hemoglobin: 15.1 g/dL (ref 11.7–15.5)
MCH: 29 pg (ref 27.0–33.0)
MCHC: 33.8 g/dL (ref 32.0–36.0)
MCV: 85.8 fL (ref 80.0–100.0)
MPV: 10.9 fL (ref 7.5–12.5)
Platelets: 189 10*3/uL (ref 140–400)
RBC: 5.21 10*6/uL — ABNORMAL HIGH (ref 3.80–5.10)
RDW: 12.1 % (ref 11.0–15.0)
WBC: 5.5 10*3/uL (ref 3.8–10.8)

## 2018-06-21 LAB — HEMOCCULT GUIAC POC 1CARD (OFFICE): Fecal Occult Blood, POC: NEGATIVE

## 2018-06-21 NOTE — Patient Instructions (Signed)
We will get labs for you today to make sure you have not lost significant blood, and that your liver/ kidneys are normal Assuming these look ok and that your bleeding is resolved, you likely have some hemorrhoidal bleeding or bleeding due to colitis.    At this time your bleeding seems to have stopped- hopefully it will stay that way.  However if your bleeding returns please alert me.  A small amount of blood with a BM is cause for you to to call me- if you are bleeding significantly however, even without a BM is cause for the ER!    Please keep me posted I will be in touch with your labs and will set you up to see GI later on

## 2018-06-21 NOTE — Telephone Encounter (Signed)
  Pt. Reports she started seeing blood mixed in with her stools yesterday. Has 4 stools since yesterday.  Also saw blood when wiping. Had abdominal cramping yesterday - none today. Denies any other symptoms. Warm transferred to Kirsten in the practice.             Answer Assessment - Initial Assessment Questions 1. APPEARANCE of BLOOD: "What color is it?" "Is it passed separately, on the surface of the stool, or mixed in with the stool?"      Mixed in with stool 2. AMOUNT: "How much blood was passed?"      Varies 3. FREQUENCY: "How many times has blood been passed with the stools?"      4 times 4. ONSET: "When was the blood first seen in the stools?" (Days or weeks)      Yesterday 5. DIARRHEA: "Is there also some diarrhea?" If so, ask: "How many diarrhea stools were passed in past 24 hours?"      No 6. CONSTIPATION: "Do you have constipation?" If so, "How bad is it?"     No 7. RECURRENT SYMPTOMS: "Have you had blood in your stools before?" If so, ask: "When was the last time?" and "What happened that time?"      No 8. BLOOD THINNERS: "Do you take any blood thinners?" (e.g., Coumadin/warfarin, Pradaxa/dabigatran, aspirin)     No 9. OTHER SYMPTOMS: "Do you have any other symptoms?"  (e.g., abdominal pain, vomiting, dizziness, fever)     Cramping yesterday  10. PREGNANCY: "Is there any chance you are pregnant?" "When was your last menstrual period?"       No  Protocols used: RECTAL BLEEDING-A-AH

## 2018-06-21 NOTE — Progress Notes (Addendum)
[facility] [address] [place], [place] [address] [phone]  Date:  06/21/2018   Name:  Marissa Butler   DOB:  02/19/1981   MRN:  [id number]  PCP:  Pearline Cables, MD    Chief Complaint: Blood In Stools (2 days, blood in stool, no fever, cramping)   History of Present Illness:  Marissa Butler is a 38 y.o. very pleasant female patient who presents with the following:  Pt who I last saw one year ago for a CPE-started a virtual visit with and have patient come in due to the nature of her complaint  She notes that yesterday she saw some bright red blood in her stool; seemed to be mixed in the stool, and also dripped from the anus after bowel movement No pain with BM however This  occurred 3x yesterday and once today Yesterday she had some mild abd pain, none today No vomiting  She is eating normally No sick contacts at home.  She is not aware of any unusual or suspicious foods  Never had this in the past  She wonders if she might have an internal hemorrhoid-she is aware that she has palpable external hemorrhoids She will use remedies for constipation on occasion-however she has not noted any recent significant constipation   She has never had a colonoscopy No family history of colon cancer as far as she is aware   LMP was 3 weeks ago.  She is on contraceptive pills, no suspicion of current pregnancy  No fever, cough SOB or ST   Her family is well -her oldest is working for a while before going to college, her youngest is 37 years old  There are no active problems to display for this patient.   Past Medical History:  Diagnosis Date  . Asthma     Past Surgical History:  Procedure Laterality Date  . NO PAST SURGERIES      Social History   Tobacco Use  . Smoking status: Never Smoker  . Smokeless tobacco: Never Used  Substance Use Topics  . Alcohol use: No  . Drug use: No    Family History   Problem Relation Age of Onset  . Hypertension Mother     Allergies  Allergen Reactions  . Shellfish Allergy     Medication list has been reviewed and updated.  Current Outpatient Medications on File Prior to Visit  Medication Sig Dispense Refill  . albuterol (PROVENTIL HFA;VENTOLIN HFA) 108 (90 Base) MCG/ACT inhaler Inhale 2 puffs into the lungs every 6 (six) hours as needed for wheezing or shortness of breath. 1 Inhaler 0  . LILLOW 0.15-30 MG-MCG tablet TAKE 1 TABLET BY MOUTH EVERY DAY 28 tablet 5  . Naproxen Sodium (ALEVE PO) Take by mouth as needed.     No current facility-administered medications on file prior to visit.     Review of Systems:  As per HPI- otherwise negative. No rash, no chest pain or shortness of breath  Physical Examination: Vitals:   06/21/18 1455  BP: 120/82  Pulse: 81  Resp: 16  Temp: 98.5 F (36.9 C)  SpO2: 98%   Vitals:   06/21/18 1455  Weight: 169 lb (76.7 kg)  Height: 5\' 9"  (1.753 m)   Body mass index is 24.96 kg/m. Ideal Body Weight: Weight in (lb) to have BMI = 25: 168.9 BP Readings from Last 3 Encounters:  06/21/18 120/82  06/01/17 120/72  03/31/16  129/81   Pulse Readings from Last 3 Encounters:  06/21/18 81  06/01/17 77  03/31/16 81    GEN: WDWN, NAD, Non-toxic, A & O x 3, looks well, tall build HEENT: Atraumatic, Normocephalic. Neck supple. No masses, No LAD. Ears and Nose: No external deformity. CV: RRR, No M/G/R. No JVD. No thrill. No extra heart sounds. PULM: CTA B, no wheezes, crackles, rhonchi. No retractions. No resp. distress. No accessory muscle use. ABD: S, NT, ND, +BS. No rebound. No HSM.  Belly is non tender, benign EXTR: No c/c/e NEURO Normal gait.  PSYCH: Normally interactive. Conversant. Not depressed or anxious appearing.  Calm demeanor.  Rectal exam: Digital exam reveals no gross blood.  Hemoccult was also negative.  There is no tenderness to suggest a fissure.  She does have external hemorrhoids, and 1  of them looks as though it may have bled recently.   Assessment and Plan: Blood in stool - Plan: Comprehensive metabolic panel, IFOBT POC (occult bld, rslt in office), POCT Occult Blood Stool, CBC, Ambulatory referral to Gastroenterology, CANCELED: CBC  Patient had 3 episodes of bright red blood in her stool yesterday, and one this morning about 5 and half hours prior to visit.  However at time of visit she had no gross bleeding, Hemoccult test was negative.  We hope that her bleeding may have now resolved I suspect bleeding was due to either colitis, or perhaps an internal hemorrhoid.  We will check labs today and make sure hemoglobin is stable.  I have advised patient that if she does have any significant bleeding, especially in between bowel movements, she needs to go to the ER.  She states understanding Otherwise I will follow-up with her labs, will refer to GI for nonurgent evaluation   Signed Abbe AmsterdamJessica Rody Keadle, MD  Received her labs so far, message to patient  Results for orders placed or performed in visit on 06/21/18  CBC  Result Value Ref Range   WBC 5.5 3.8 - 10.8 Thousand/uL   RBC 5.21 (H) 3.80 - 5.10 Million/uL   Hemoglobin 15.1 11.7 - 15.5 g/dL   HCT 46.944.7 62.935.0 - 52.845.0 %   MCV 85.8 80.0 - 100.0 fL   MCH 29.0 27.0 - 33.0 pg   MCHC 33.8 32.0 - 36.0 g/dL   RDW 41.312.1 24.411.0 - 01.015.0 %   Platelets 189 140 - 400 Thousand/uL   MPV 10.9 7.5 - 12.5 fL  POCT Occult Blood Stool  Result Value Ref Range   Fecal Occult Blood, POC Negative Negative   Card #1 Date 06/21/2018    Card #2 Fecal Occult Blod, POC     Card #2 Date     Card #3 Fecal Occult Blood, POC     Card #3 Date

## 2018-06-21 NOTE — Telephone Encounter (Signed)
FYI patient appointment this afternoon.

## 2018-06-22 ENCOUNTER — Encounter: Payer: Self-pay | Admitting: Family Medicine

## 2018-06-22 DIAGNOSIS — J45990 Exercise induced bronchospasm: Secondary | ICD-10-CM

## 2018-06-22 LAB — COMPREHENSIVE METABOLIC PANEL
ALT: 17 U/L (ref 0–35)
AST: 17 U/L (ref 0–37)
Albumin: 4.4 g/dL (ref 3.5–5.2)
Alkaline Phosphatase: 65 U/L (ref 39–117)
BUN: 9 mg/dL (ref 6–23)
CO2: 24 mEq/L (ref 19–32)
Calcium: 9.3 mg/dL (ref 8.4–10.5)
Chloride: 103 mEq/L (ref 96–112)
Creatinine, Ser: 0.99 mg/dL (ref 0.40–1.20)
GFR: 76.51 mL/min (ref >60.00)
Glucose, Bld: 77 mg/dL (ref 70–99)
Potassium: 3.8 mEq/L (ref 3.5–5.1)
Sodium: 136 mEq/L (ref 135–145)
Total Bilirubin: 0.5 mg/dL (ref 0.2–1.2)
Total Protein: 7.6 g/dL (ref 6.0–8.3)

## 2018-06-25 ENCOUNTER — Telehealth: Payer: Self-pay | Admitting: *Deleted

## 2018-06-25 ENCOUNTER — Other Ambulatory Visit: Payer: Self-pay | Admitting: Family Medicine

## 2018-06-25 DIAGNOSIS — Z3009 Encounter for other general counseling and advice on contraception: Secondary | ICD-10-CM

## 2018-06-25 MED ORDER — ALBUTEROL SULFATE HFA 108 (90 BASE) MCG/ACT IN AERS: 2.0000 | INHALATION_SPRAY | Freq: Four times a day (QID) | RESPIRATORY_TRACT | 3 refills | Status: DC | PRN

## 2018-06-25 NOTE — Telephone Encounter (Signed)
Received Lab Report results from [facility]; forwarded to provider/SLS 04/24

## 2018-12-18 ENCOUNTER — Other Ambulatory Visit: Payer: Self-pay | Admitting: Family Medicine

## 2018-12-18 DIAGNOSIS — Z3009 Encounter for other general counseling and advice on contraception: Secondary | ICD-10-CM

## 2019-02-09 ENCOUNTER — Telehealth: Payer: Self-pay

## 2019-02-09 NOTE — Telephone Encounter (Signed)
Message form the patient " The numbness happens often but only on my left side. If I am sitting in the same position for a long. 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 I have some other concerns about my nails also not sure if it is a vitamin deficiency or something else. I also need my annual physical for my insurance"

## 2019-02-09 NOTE — Telephone Encounter (Signed)
Let's call her and get her in for an appt- in person.  Today or tomorrow  When you call please ask about duration of numbness.  If new- just the last few days- need to see her today.  If long standing tomorrow or next week is ok

## 2019-02-09 NOTE — Telephone Encounter (Signed)
Are you ok with her waiting that long?

## 2019-02-09 NOTE — Telephone Encounter (Signed)
Dr. Lorelei Pont I couldn't figure out a way to copy the notes in to this encounter-could you look at the conversation between the patient and I? Please advise on what I should tell the patient

## 2019-02-09 NOTE — Telephone Encounter (Signed)
Tried calling patient, no asnwer-send patient [name].

## 2019-03-01 DIAGNOSIS — H5213 Myopia, bilateral: Secondary | ICD-10-CM | POA: Diagnosis not present

## 2019-04-03 NOTE — Progress Notes (Addendum)
[facility] [address], [address] [place], [place] [address] [phone]  Date:  04/04/2019   Name:  Marissa Butler   DOB:  Jan 01, 1981   MRN:  [id number]  PCP:  Pearline Cables, MD    Chief Complaint: Annual Exam   History of Present Illness:  Marissa Butler is a 39 y.o. very pleasant female patient who presents with the following:  Generally healthy young woman here today for follow-up visit and physical History of asthma-  She may get SOB with exertion- she uses her puffer prior to exercise She is also noticed some shortness of breath with mild activity recently, feels like her asthma may be getting a bit worse  Last seen by myself in April 2020-at that time she had some blood in her stool, referred her to gastroenterology ?  Did she ended up being seen- she did not end up being seen due to pandemic No more blood in her stool however  Flu vaccine- done Pap is up-to-date Tetanus up-to-date Labs done in April, can update today Discussed baseline mammogram.  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   Past Medical History:  Diagnosis Date  . Asthma     Past Surgical History:  Procedure Laterality Date  . NO PAST SURGERIES      Social History   Tobacco Use  . Smoking status: Never Smoker  . Smokeless tobacco: Never Used  Substance Use Topics  . Alcohol use: No  . Drug use: No    Family History  Problem Relation Age of Onset  . Hypertension Mother     Allergies  Allergen Reactions  . Shellfish Allergy      Medication list has been reviewed and updated.  Current Outpatient Medications on File Prior to Visit  Medication Sig Dispense Refill  . albuterol (VENTOLIN HFA) 108 (90 Base) MCG/ACT inhaler Inhale 2 puffs into the lungs every 6 (six) hours as needed for wheezing or shortness of breath. 1 Inhaler 3  . LILLOW 0.15-30 MG-MCG tablet TAKE 1 TABLET BY MOUTH EVERY DAY 28 tablet 5  . Naproxen Sodium (ALEVE PO) Take by mouth as needed.     No current facility-administered medications on file prior to visit.    Review of Systems:  As per HPI- otherwise negative. No fever or chills Notes that she has gained a few pounds during the pandemic  Physical Examination: Vitals:   04/04/19 1348  BP: 122/80  Pulse: 81  Resp: 16  Temp: (!) 97.4 F (36.3 C)  SpO2: 98%   Vitals:   04/04/19 1348  Weight: 178 lb (80.7 kg)  Height: 5\' 9"  (1.753 m)   Body mass index is 26.29 kg/m. Ideal Body Weight: Weight in (lb) to have BMI = 25: 168.9  GEN: WDWN, NAD, Non-toxic, A & O x 3, mild overweight, looks well HEENT: Atraumatic, Normocephalic. Neck supple. No masses, No LAD. Ears and Nose: No external deformity. CV: RRR, No M/G/R. No JVD. No thrill. No extra heart  sounds. PULM: CTA B, no wheezes, crackles, rhonchi. No retractions. No resp. distress. No accessory muscle use. ABD: S, NT, ND. No rebound. No HSM. EXTR: No c/c/e NEURO Normal gait.  PSYCH: Normally interactive. Conversant. Not depressed or anxious appearing.  Calm demeanor.   Wt Readings from Last 3 Encounters:  04/04/19 178 lb (80.7 kg)  06/21/18 169 lb (76.7 kg)  06/01/17 161 lb 6.4 oz (73.2 kg)    Assessment and Plan: Physical exam  Exercise-induced asthma - Plan: Budesonide (PULMICORT FLEXHALER) 90 MCG/ACT inhaler, albuterol (VENTOLIN HFA) 108 (90 Base) MCG/ACT inhaler  Screening for deficiency anemia - Plan: CBC  Screening for hyperlipidemia - Plan: Lipid panel  Screening for diabetes mellitus - Plan: Comprehensive  metabolic panel, Hemoglobin A1c  Screening for thyroid disorder - Plan: TSH  Encounter for other general counseling or advice on contraception - Plan: levonorgestrel-ethinyl estradiol (LILLOW) 0.15-30 MG-MCG tablet  Weight gain  Here today for a physical exam and follow-up She did have rectal bleeding last year, this has resolved and not recurred.  We will check CBC today Refill birth control pill, she is happy with her current medication Some worsening of her asthma symptoms recently.  We will add a daily inhaled steroid, advised that she will still likely need albuterol prior to exercise.  She will let me know how this works for her Some weight gain over the last year, likely due to pandemic and stress.  She will work on gradual weight loss, advised ideal weight about 165 to 170lbs Will plan further follow- up pending labs.  This visit occurred during the SARS-CoV-2 public health emergency.  Safety protocols were in place, including screening questions prior to the visit, additional usage of staff PPE, and extensive cleaning of exam room while observing appropriate contact time as indicated for disinfecting solutions.    Signed Lamar Blinks, MD  Received her labs, message to patient  Results for orders placed or performed in visit on 04/04/19  CBC  Result Value Ref Range   WBC 5.0 4.0 - 10.5 K/uL   RBC 5.16 (H) 3.87 - 5.11 Mil/uL   Platelets 192.0 150.0 - 400.0 K/uL   Hemoglobin 15.0 12.0 - 15.0 g/dL   HCT 44.9 36.0 - 46.0 %   MCV 87.1 78.0 - 100.0 fl   MCHC 33.3 30.0 - 36.0 g/dL   RDW 12.9 11.5 - 15.5 %  Comprehensive metabolic panel  Result Value Ref Range   Sodium 137 135 - 145 mEq/L   Potassium 3.9 3.5 - 5.1 mEq/L   Chloride 104 96 - 112 mEq/L   CO2 24 19 - 32 mEq/L   Glucose, Bld 84 70 - 99 mg/dL   BUN 8 6 - 23 mg/dL   Creatinine, Ser 1.01 0.40 - 1.20 mg/dL   Total Bilirubin 0.5 0.2 - 1.2 mg/dL   Alkaline Phosphatase 53 39 - 117 U/L   AST 13 0 - 37 U/L   ALT 10  0 - 35 U/L   Total Protein 7.4 6.0 - 8.3 g/dL   Albumin 4.2 3.5 - 5.2 g/dL   GFR 74.45 >60.00 mL/min   Calcium 9.0 8.4 - 10.5 mg/dL  Hemoglobin A1c  Result Value Ref Range   Hgb A1c MFr Bld 5.2 4.6 - 6.5 %  Lipid panel  Result Value Ref Range   Cholesterol 177 0 - 200 mg/dL   Triglycerides 44.0 0.0 - 149.0 mg/dL   HDL 56.30 >39.00 mg/dL   VLDL 8.8 0.0 - 40.0 mg/dL  LDL Cholesterol 112 (H) 0 - 99 mg/dL   Total CHOL/HDL Ratio 3    NonHDL 120.80   TSH  Result Value Ref Range   TSH 0.97 0.35 - 4.50 uIU/mL

## 2019-04-03 NOTE — Patient Instructions (Addendum)
It was good to see you again today, I will be in touch with your labs as soon as possible Let's try adding an inhaled steroid to your regimen- let me know if you need a different brand for insurance coverage and also how it works for you.  You would use it every day in hopes of lessening your overall resp symptoms    Health Maintenance, Female Adopting a healthy lifestyle and getting preventive care are important in promoting health and wellness. Ask your health care provider about:  The right schedule for you to have regular tests and exams.  Things you can do on your own to prevent diseases and keep yourself healthy. What should I know about diet, weight, and exercise? Eat a healthy diet   Eat a diet that includes plenty of vegetables, fruits, low-fat dairy products, and lean protein.  Do not eat a lot of foods that are high in solid fats, added sugars, or sodium. Maintain a healthy weight Body mass index (BMI) is used to identify weight problems. It estimates body fat based on height and weight. Your health care provider can help determine your BMI and help you achieve or maintain a healthy weight. Get regular exercise Get regular exercise. This is one of the most important things you can do for your health. Most adults should:  Exercise for at least 150 minutes each week. The exercise should increase your heart rate and make you sweat (moderate-intensity exercise).  Do strengthening exercises at least twice a week. This is in addition to the moderate-intensity exercise.  Spend less time sitting. Even light physical activity can be beneficial. Watch cholesterol and blood lipids Have your blood tested for lipids and cholesterol at 38 years of age, then have this test every 5 years. Have your cholesterol levels checked more often if:  Your lipid or cholesterol levels are high.  You are older than 37 years of age.  You are at high risk for heart disease. What should I know about  cancer screening? Depending on your health history and family history, you may need to have cancer screening at various ages. This may include screening for:  Breast cancer.  Cervical cancer.  Colorectal cancer.  Skin cancer.  Lung cancer. What should I know about heart disease, diabetes, and high blood pressure? Blood pressure and heart disease  High blood pressure causes heart disease and increases the risk of stroke. This is more likely to develop in people who have high blood pressure readings, are of African descent, or are overweight.  Have your blood pressure checked: ? Every 3-5 years if you are 18-50 years of age. ? Every year if you are 31 years old or older. Diabetes Have regular diabetes screenings. This checks your fasting blood sugar level. Have the screening done:  Once every three years after age 71 if you are at a normal weight and have a low risk for diabetes.  More often and at a younger age if you are overweight or have a high risk for diabetes. What should I know about preventing infection? Hepatitis B If you have a higher risk for hepatitis B, you should be screened for this virus. Talk with your health care provider to find out if you are at risk for hepatitis B infection. Hepatitis C Testing is recommended for:  Everyone born from [birth year] through 1964.  Anyone with known risk factors for hepatitis C. Sexually transmitted infections (STIs)  Get screened for STIs, including gonorrhea and chlamydia, if: ?  You are sexually active and are younger than 38 years of age. ? You are older than 38 years of age and your health care provider tells you that you are at risk for this type of infection. ? Your sexual activity has changed since you were last screened, and you are at increased risk for chlamydia or gonorrhea. Ask your health care provider if you are at risk.  Ask your health care provider about whether you are at high risk for HIV. Your health care  provider may recommend a prescription medicine to help prevent HIV infection. If you choose to take medicine to prevent HIV, you should first get tested for HIV. You should then be tested every 3 months for as long as you are taking the medicine. Pregnancy  If you are about to stop having your period (premenopausal) and you may become pregnant, seek counseling before you get pregnant.  Take 400 to 800 micrograms (mcg) of folic acid every day if you become pregnant.  Ask for birth control (contraception) if you want to prevent pregnancy. Osteoporosis and menopause Osteoporosis is a disease in which the bones lose minerals and strength with aging. This can result in bone fractures. If you are 70 years old or older, or if you are at risk for osteoporosis and fractures, ask your health care provider if you should:  Be screened for bone loss.  Take a calcium or vitamin D supplement to lower your risk of fractures.  Be given hormone replacement therapy (HRT) to treat symptoms of menopause. Follow these instructions at home: Lifestyle  Do not use any products that contain nicotine or tobacco, such as cigarettes, e-cigarettes, and chewing tobacco. If you need help quitting, ask your health care provider.  Do not use street drugs.  Do not share needles.  Ask your health care provider for help if you need support or information about quitting drugs. Alcohol use  Do not drink alcohol if: ? Your health care provider tells you not to drink. ? You are pregnant, may be pregnant, or are planning to become pregnant.  If you drink alcohol: ? Limit how much you use to 0-1 drink a day. ? Limit intake if you are breastfeeding.  Be aware of how much alcohol is in your drink. In the [place] one drink equals one 12 oz bottle of beer (355 mL), one 5 oz glass of wine (148 mL), or one 1 oz glass of hard liquor (44 mL). General instructions  Schedule regular health, dental, and eye exams.  Stay current  with your vaccines.  Tell your health care provider if: ? You often feel depressed. ? You have ever been abused or do not feel safe at home. Summary  Adopting a healthy lifestyle and getting preventive care are important in promoting health and wellness.  Follow your health care provider's instructions about healthy diet, exercising, and getting tested or screened for diseases.  Follow your health care provider's instructions on monitoring your cholesterol and blood pressure. This information is not intended to replace advice given to you by your health care provider. Make sure you discuss any questions you have with your health care provider. Document Revised: 02/10/2018 Document Reviewed: 02/10/2018 Elsevier Patient Education  2020 [employer].

## 2019-04-04 ENCOUNTER — Other Ambulatory Visit: Payer: Self-pay | Admitting: Family Medicine

## 2019-04-04 ENCOUNTER — Encounter: Payer: Self-pay | Admitting: Family Medicine

## 2019-04-04 ENCOUNTER — Ambulatory Visit (INDEPENDENT_AMBULATORY_CARE_PROVIDER_SITE_OTHER): Payer: 59 | Admitting: Family Medicine

## 2019-04-04 ENCOUNTER — Other Ambulatory Visit: Payer: Self-pay

## 2019-04-04 VITALS — BP 122/80 | HR 81 | Temp 97.4°F | Resp 16 | Ht 69.0 in | Wt 178.0 lb

## 2019-04-04 DIAGNOSIS — Z13220 Encounter for screening for lipoid disorders: Secondary | ICD-10-CM | POA: Diagnosis not present

## 2019-04-04 DIAGNOSIS — Z130 Encounter for screening for diseases of the blood and blood-forming organs and certain disorders involving the immune mechanism: Secondary | ICD-10-CM

## 2019-04-04 DIAGNOSIS — J45990 Exercise induced bronchospasm: Secondary | ICD-10-CM | POA: Diagnosis not present

## 2019-04-04 DIAGNOSIS — Z0000 Encounter for general adult medical examination without abnormal findings: Secondary | ICD-10-CM

## 2019-04-04 DIAGNOSIS — Z1329 Encounter for screening for other suspected endocrine disorder: Secondary | ICD-10-CM

## 2019-04-04 DIAGNOSIS — R635 Abnormal weight gain: Secondary | ICD-10-CM

## 2019-04-04 DIAGNOSIS — Z131 Encounter for screening for diabetes mellitus: Secondary | ICD-10-CM | POA: Diagnosis not present

## 2019-04-04 DIAGNOSIS — Z3009 Encounter for other general counseling and advice on contraception: Secondary | ICD-10-CM | POA: Diagnosis not present

## 2019-04-04 LAB — COMPREHENSIVE METABOLIC PANEL
ALT: 10 U/L (ref 0–35)
AST: 13 U/L (ref 0–37)
Albumin: 4.2 g/dL (ref 3.5–5.2)
Alkaline Phosphatase: 53 U/L (ref 39–117)
BUN: 8 mg/dL (ref 6–23)
CO2: 24 mEq/L (ref 19–32)
Calcium: 9 mg/dL (ref 8.4–10.5)
Chloride: 104 mEq/L (ref 96–112)
Creatinine, Ser: 1.01 mg/dL (ref 0.40–1.20)
GFR: 74.45 mL/min (ref >60.00)
Glucose, Bld: 84 mg/dL (ref 70–99)
Potassium: 3.9 mEq/L (ref 3.5–5.1)
Sodium: 137 mEq/L (ref 135–145)
Total Bilirubin: 0.5 mg/dL (ref 0.2–1.2)
Total Protein: 7.4 g/dL (ref 6.0–8.3)

## 2019-04-04 LAB — LIPID PANEL
Cholesterol: 177 mg/dL (ref 0–200)
HDL: 56.3 mg/dL (ref >39.00)
LDL Cholesterol: 112 mg/dL — ABNORMAL HIGH (ref 0–99)
NonHDL: 120.8
Total CHOL/HDL Ratio: 3
Triglycerides: 44 mg/dL (ref 0.0–149.0)
VLDL: 8.8 mg/dL (ref 0.0–40.0)

## 2019-04-04 LAB — CBC
HCT: 44.9 % (ref 36.0–46.0)
Hemoglobin: 15 g/dL (ref 12.0–15.0)
MCHC: 33.3 g/dL (ref 30.0–36.0)
MCV: 87.1 fl (ref 78.0–100.0)
Platelets: 192 10*3/uL (ref 150.0–400.0)
RBC: 5.16 Mil/uL — ABNORMAL HIGH (ref 3.87–5.11)
RDW: 12.9 % (ref 11.5–15.5)
WBC: 5 10*3/uL (ref 4.0–10.5)

## 2019-04-04 LAB — HEMOGLOBIN A1C: Hgb A1c MFr Bld: 5.2 % (ref 4.6–6.5)

## 2019-04-04 LAB — TSH: TSH: 0.97 u[IU]/mL (ref 0.35–4.50)

## 2019-04-04 MED ORDER — LEVONORGESTREL-ETHINYL ESTRAD 0.15-30 MG-MCG PO TABS: 1.0000 | ORAL_TABLET | Freq: Every day | ORAL | 4 refills | Status: AC

## 2019-04-04 MED ORDER — PULMICORT FLEXHALER 90 MCG/ACT IN AEPB: 2.0000 | INHALATION_SPRAY | Freq: Two times a day (BID) | RESPIRATORY_TRACT | 11 refills | Status: DC

## 2019-04-04 MED ORDER — ALBUTEROL SULFATE HFA 108 (90 BASE) MCG/ACT IN AERS: 2.0000 | INHALATION_SPRAY | Freq: Four times a day (QID) | RESPIRATORY_TRACT | 10 refills | Status: AC | PRN

## 2019-04-07 ENCOUNTER — Telehealth: Payer: Self-pay

## 2019-04-07 DIAGNOSIS — J45990 Exercise induced bronchospasm: Secondary | ICD-10-CM

## 2019-04-07 MED ORDER — ARNUITY ELLIPTA 100 MCG/ACT IN AEPB: 1.0000 | INHALATION_SPRAY | Freq: Every day | RESPIRATORY_TRACT | 6 refills | Status: AC

## 2019-04-07 NOTE — Telephone Encounter (Signed)
Sent updated rx

## 2019-04-07 NOTE — Telephone Encounter (Signed)
Pharmacist at [facility] on [facility] [facility] [address] [facility] called asking for clarification on her Arnuity Ellpita inhaler. There are no directions on how patient is to take medication. Please advise and I will call pharmacy back at [phone]

## 2019-04-07 NOTE — Addendum Note (Signed)
Addended by: Abbe Amsterdam C on: 04/07/2019 01:04 PM   Modules accepted: Orders

## 2019-04-12 ENCOUNTER — Other Ambulatory Visit: Payer: Self-pay

## 2019-04-12 NOTE — Progress Notes (Signed)
[facility] [address] [place], [place] [address] [phone]  Date:  04/13/2019   Name:  Marissa Butler   DOB:  1980-06-05   MRN:  [id number]  PCP:  Pearline Cables, MD    Chief Complaint: No chief complaint on file.   History of Present Illness:  Marissa Butler is a 39 y.o. very pleasant female patient who presents with the following:  Generally healthy young woman who I saw recently for a CPE, here today with concern of bumps on her head and neck Recent normal labs She has noted a couple of bumps on the right side of her head and neck- present for a month or more, however she has one on the side of her neck that has been present for just a few days.  She thinks likely lymph nodes  She had forgotten to mention the bump on the back of her head at a recent physical, but then the new bump on the side of her neck appeared, prompting her to call Not tender She feels like one on her posterior neck is getting larger  No other unusual bumps anywhere  She otherwise feels well, no cough, fever, tooth pain, night sweats, unexpected weight loss or gain  There are no problems to display for this patient.   Past Medical History:  Diagnosis Date  . Asthma     Past Surgical History:  Procedure Laterality Date  . NO PAST SURGERIES      Social History   Tobacco Use  . Smoking status: Never Smoker  . Smokeless tobacco: Never Used  Substance Use Topics  . Alcohol use: No  . Drug use: No    Family History  Problem Relation Age of Onset  . Hypertension Mother     Allergies  Allergen Reactions  . Shellfish Allergy     Medication list has been reviewed and updated.  Current Outpatient Medications on File Prior to Visit  Medication Sig Dispense Refill  . albuterol (VENTOLIN HFA) 108 (90 Base) MCG/ACT inhaler Inhale 2 puffs into the lungs every 6 (six) hours as needed for wheezing or shortness of breath.  18 g 10  . Fluticasone Furoate (ARNUITY ELLIPTA) 100 MCG/ACT AEPB Inhale 1 puff into the lungs daily. 30 each 6  . levonorgestrel-ethinyl estradiol (LILLOW) 0.15-30 MG-MCG tablet Take 1 tablet by mouth daily. 3 Package 4  . Naproxen Sodium (ALEVE PO) Take by mouth as needed.    . [DISCONTINUED] Budesonide (PULMICORT FLEXHALER) 90 MCG/ACT inhaler Inhale 2 puffs into the lungs 2 (two) times daily. 1 each 11   No current facility-administered medications on file prior to visit.    Review of Systems:  As per HPI- otherwise negative.   Physical Examination: Vitals:   04/13/19 1546  BP: 120/80  Pulse: 66   There were no vitals filed for this visit. There is no height or weight on file to calculate BMI. Ideal Body Weight:    GEN: no acute distress.,  Well-appearing woman HEENT: Atraumatic, Normocephalic.   Bilateral TM wnl, oropharynx normal.  PEERL,EOMI.   No tooth decay or other issues noted, no tooth sensitivity The right-side aspect of her posterior neck displays an approximately 1 cm diameter mobile, rubbery lymph node.  There is a smaller lymph node on the right side of her neck Ears and Nose: No external deformity. CV: RRR, No M/G/R. No JVD. No thrill. No extra heart sounds. PULM: CTA  B, no wheezes, crackles, rhonchi. No retractions. No resp. distress. No accessory muscle use. EXTR: No c/c/e PSYCH: Normally interactive. Conversant.    Assessment and Plan: Lymphadenopathy of head and neck - Plan: US Soft Tissue Head/Neck  Here today with concern of cervical lymphadenopathy.  I agree that these nodes are somewhat concerning, would like to do further evaluation.  We will set up an ultrasound to get more information, biopsy if indicated  Patient states understanding and agreement with plan Moderate medical decision making today This visit occurred during the SARS-CoV-2 public health emergency.  Safety protocols were in place, including screening questions prior to the visit,  additional usage of staff PPE, and extensive cleaning of exam room while observing appropriate contact time as indicated for disinfecting solutions.    Signed Lamar Blinks, MD

## 2019-04-13 ENCOUNTER — Encounter: Payer: Self-pay | Admitting: Family Medicine

## 2019-04-13 ENCOUNTER — Ambulatory Visit (INDEPENDENT_AMBULATORY_CARE_PROVIDER_SITE_OTHER): Payer: 59 | Admitting: Family Medicine

## 2019-04-13 VITALS — BP 120/80 | HR 66

## 2019-04-13 DIAGNOSIS — R591 Generalized enlarged lymph nodes: Secondary | ICD-10-CM | POA: Diagnosis not present

## 2019-04-13 NOTE — Patient Instructions (Signed)
Good to see you today- we will set up an ultrasound to check on the nodes you have noticed on your neck.  Please let me know if you don't hear about this ultrasound by Friday

## 2019-04-14 ENCOUNTER — Other Ambulatory Visit (HOSPITAL_BASED_OUTPATIENT_CLINIC_OR_DEPARTMENT_OTHER): Payer: Self-pay | Admitting: *Deleted

## 2019-04-15 ENCOUNTER — Other Ambulatory Visit: Payer: Self-pay

## 2019-04-15 ENCOUNTER — Ambulatory Visit (HOSPITAL_BASED_OUTPATIENT_CLINIC_OR_DEPARTMENT_OTHER)
Admission: RE | Admit: 2019-04-15 | Discharge: 2019-04-15 | Disposition: A | Discharge status: Home / Self Care | Payer: 59 | Source: Ambulatory Visit | Attending: Family Medicine | Admitting: Family Medicine

## 2019-04-15 DIAGNOSIS — R590 Localized enlarged lymph nodes: Secondary | ICD-10-CM | POA: Diagnosis not present

## 2019-04-15 DIAGNOSIS — R591 Generalized enlarged lymph nodes: Secondary | ICD-10-CM | POA: Insufficient documentation

## 2019-04-15 DIAGNOSIS — E042 Nontoxic multinodular goiter: Secondary | ICD-10-CM | POA: Diagnosis not present

## 2019-04-16 ENCOUNTER — Encounter: Payer: Self-pay | Admitting: Family Medicine

## 2019-04-16 DIAGNOSIS — R591 Generalized enlarged lymph nodes: Secondary | ICD-10-CM
